# Patient Record
Sex: Male | Born: 1945 | Race: White | Hispanic: No | Marital: Married | Smoking: Former smoker
Health system: Southern US, Community
[De-identification: ages and names within clinical notes are randomized; demographics above are authoritative.]

## PROBLEM LIST (undated history)

## (undated) DIAGNOSIS — I10 Essential (primary) hypertension: Secondary | ICD-10-CM

## (undated) DIAGNOSIS — I471 Supraventricular tachycardia, unspecified: Secondary | ICD-10-CM

## (undated) DIAGNOSIS — K219 Gastro-esophageal reflux disease without esophagitis: Secondary | ICD-10-CM

## (undated) DIAGNOSIS — F419 Anxiety disorder, unspecified: Secondary | ICD-10-CM

## (undated) DIAGNOSIS — M1990 Unspecified osteoarthritis, unspecified site: Secondary | ICD-10-CM

## (undated) HISTORY — PX: ABDOMINAL HERNIA REPAIR: SHX539

## (undated) HISTORY — PX: PILONIDAL CYST EXCISION: SHX744

## (undated) HISTORY — PX: CHOLECYSTECTOMY: SHX55

## (undated) HISTORY — PX: UPPER GI ENDOSCOPY: SHX6162

## (undated) HISTORY — PX: APPENDECTOMY: SHX54

## (undated) HISTORY — PX: NISSEN FUNDOPLICATION: SHX2091

## (undated) HISTORY — PX: ABDOMINAL EXPLORATION SURGERY: SHX538

## (undated) HISTORY — PX: ROTATOR CUFF REPAIR: SHX139

## (undated) HISTORY — PX: COLONOSCOPY: SHX174

---

## 2018-07-25 ENCOUNTER — Other Ambulatory Visit: Payer: Self-pay | Admitting: Gastroenterology

## 2018-07-25 DIAGNOSIS — R1012 Left upper quadrant pain: Secondary | ICD-10-CM

## 2018-07-28 ENCOUNTER — Ambulatory Visit
Admission: RE | Admit: 2018-07-28 | Discharge: 2018-07-28 | Disposition: A | Discharge status: Home / Self Care | Payer: Medicare Other | Source: Ambulatory Visit | Attending: Gastroenterology | Admitting: Gastroenterology

## 2018-07-28 DIAGNOSIS — R1012 Left upper quadrant pain: Secondary | ICD-10-CM

## 2018-07-28 MED ORDER — IOPAMIDOL (ISOVUE-300) INJECTION 61%
100.0000 mL | Freq: Once | INTRAVENOUS | Status: AC | PRN
  Administered 2018-07-28: 100 mL via INTRAVENOUS

## 2019-07-25 ENCOUNTER — Other Ambulatory Visit: Payer: Self-pay

## 2019-07-25 ENCOUNTER — Ambulatory Visit (INDEPENDENT_AMBULATORY_CARE_PROVIDER_SITE_OTHER): Payer: Medicare Other | Admitting: Orthopaedic Surgery

## 2019-07-25 ENCOUNTER — Ambulatory Visit (INDEPENDENT_AMBULATORY_CARE_PROVIDER_SITE_OTHER): Payer: Medicare Other

## 2019-07-25 DIAGNOSIS — M25551 Pain in right hip: Secondary | ICD-10-CM | POA: Diagnosis not present

## 2019-07-25 DIAGNOSIS — M1611 Unilateral primary osteoarthritis, right hip: Secondary | ICD-10-CM | POA: Diagnosis not present

## 2019-07-25 DIAGNOSIS — M1612 Unilateral primary osteoarthritis, left hip: Secondary | ICD-10-CM

## 2019-07-25 NOTE — Progress Notes (Signed)
Office Visit Note   Patient: Jonathan Navarro           Date of Birth: Oct 04, 1945           MRN: [id number] Visit Date: 07/25/2019              Requested by: No referring provider defined for this encounter. PCP: Carman Ching, MD (Inactive)   Assessment & Plan: Visit Diagnoses:  1. Pain in right hip   2. Unilateral primary osteoarthritis, left hip   3. Unilateral primary osteoarthritis, right hip     Plan: The patient has severe end-stage arthritis of both his hips.  I went over his x-rays in detail.  We have recommended hip replacement surgery.  Since his right hip is more severely painful to him we will proceed with the right hip first.  I went over the risk and benefits of surgery in detail.  I went over his interoperative and postoperative course showing him a hip model explaining what the surgery involves.  Given the severity of his pain I do feel this is something we need to proceed with.  Seeing him ambulate he has such a Trendelenburg gait and is lurching with his gait but he is certainly a fall risk.  We will work on getting his surgery scheduled for this month.  This will be for his right hip first.  All question concerns were answered and addressed.  Follow-Up Instructions: Return for 2 weeks post-op.   Orders:  Orders Placed This Encounter  Procedures  . XR HIP UNILAT W OR W/O PELVIS 1V RIGHT   No orders of the defined types were placed in this encounter.     Procedures: No procedures performed   Clinical Data: No additional findings.   Subjective: Chief Complaint  Patient presents with  . Right Leg - Pain  . Left Knee - Pain  The patient is a very pleasant and active 74 year old gentleman sent from Dr. Marikay Alar of neurosurgery to evaluate and treat arthritis involving both the patient's hips.  His right hip hurts quite severely.  This is been getting worse for 3 years now.  He ambulates with a cane.  He has become a fall risk due to the severity of his  pain.  It hurts with rotating types of activities in the groin on both sides.  He ambulates with a cane.  He has tried and failed all forms conservative treatment.  This is gotten so worse over the last year that it is detrimentally affecting his mobility, his quality of life and his actives of daily living.  He is an avid golfer and cannot do that due to the severity of his pain.  He is on chronic hydrocodone.  He is not a diabetic.  He is not a smoker.  He does have a history of high blood pressure.  He lives in [place].  HPI  Review of Systems He currently denies any headache, chest pain, shortness of breath, fever, chills, nausea, vomiting  Objective: Vital Signs: There were no vitals taken for this visit.  Physical Exam He is alert and oriented x3 and in no acute distress Ortho Exam Examination of both hips shows severe pain with any attempts of internal or external rotation.  Both hips are severely stiff with limitations of internal and external rotation. Specialty Comments:  No specialty comments available.  Imaging: XR HIP UNILAT W OR W/O PELVIS 1V RIGHT  Result Date: 07/25/2019 A low AP pelvis and lateral  right hip shows severe end-stage arthritis of both hips.  There is complete loss of joint space.  There are large periarticular osteophytes.  There is flattening of both femoral heads and severe sclerotic changes.    PMFS History: Patient Active Problem List   Diagnosis Date Noted  . Unilateral primary osteoarthritis, left hip 07/25/2019  . Unilateral primary osteoarthritis, right hip 07/25/2019   No past medical history on file.  No family history on file.   Social History   Occupational History  . Not on file  Tobacco Use  . Smoking status: Not on file  Substance and Sexual Activity  . Alcohol use: Not on file  . Drug use: Not on file  . Sexual activity: Not on file

## 2019-08-03 ENCOUNTER — Other Ambulatory Visit: Payer: Self-pay

## 2019-08-06 ENCOUNTER — Other Ambulatory Visit: Payer: Self-pay | Admitting: Physician Assistant

## 2019-08-08 NOTE — Patient Instructions (Addendum)
DUE TO COVID-19 ONLY ONE VISITOR IS ALLOWED TO COME WITH YOU AND STAY IN THE WAITING ROOM ONLY DURING PRE OP AND PROCEDURE DAY OF SURGERY. THE 1 VISITOR MAY VISIT WITH YOU AFTER SURGERY IN YOUR PRIVATE ROOM DURING VISITING HOURS ONLY!  YOU NEED TO HAVE A COVID 19 TEST ON: 08/14/19 @ 2:00 pm , THIS TEST MUST BE DONE BEFORE SURGERY, COME  [address], [place] , [address].  [facility]) ONCE YOUR COVID TEST IS COMPLETED, PLEASE BEGIN THE QUARANTINE INSTRUCTIONS AS OUTLINED IN YOUR HANDOUT.                [name]    Your procedure is scheduled on: 08/17/19   Report to [facility] Main  Entrance   Report to admitting at: 8:30 AM     Call this number if you have problems the morning of surgery [phone]    Remember:    BRUSH YOUR TEETH MORNING OF SURGERY AND RINSE YOUR MOUTH OUT, NO CHEWING GUM CANDY OR MINTS.     Take these medicines the morning of surgery with A SIP OF WATER: DILTIAZEM,ESOMEPRAZOLE,METOPROLOL. XANAX AS NEEDED.                                 You may not have any metal on your body including hair pins and              piercings  Do not wear jewelry,lotions, powders or perfumes, deodorant             Men may shave face and neck.   Do not bring valuables to the hospital. [facility] IS NOT             RESPONSIBLE   FOR VALUABLES.  Contacts, dentures or bridgework may not be worn into surgery.  Leave suitcase in the car. After surgery it may be brought to your room.     Patients discharged the day of surgery will not be allowed to drive home. IF YOU ARE HAVING SURGERY AND GOING HOME THE SAME DAY, YOU MUST HAVE AN ADULT TO DRIVE YOU HOME AND BE WITH YOU FOR 24 HOURS. YOU MAY GO HOME BY TAXI OR UBER OR ORTHERWISE, BUT AN ADULT MUST ACCOMPANY YOU HOME AND STAY WITH YOU FOR 24 HOURS.  Name and phone number of your driver:  Special Instructions: N/A              Please read over the following fact sheets you were  given: _____________________________________________________________________             NO SOLID FOOD AFTER MIDNIGHT THE NIGHT PRIOR TO SURGERY. NOTHING BY MOUTH EXCEPT CLEAR LIQUIDS UNTIL: 8:00 AM . PLEASE FINISH ENSURE DRINK PER SURGEON ORDER  WHICH NEEDS TO BE COMPLETED AT : 8:00 AM.   CLEAR LIQUID DIET   Foods Allowed                                                                     Foods Excluded  Coffee and tea, regular and decaf  liquids that you cannot  Plain Jell-O any favor except red or purple                                           see through such as: Fruit ices (not with fruit pulp)                                     milk, soups, orange juice  Iced Popsicles                                    All solid food Carbonated beverages, regular and diet                                    Cranberry, grape and apple juices Sports drinks like Gatorade Lightly seasoned clear broth or consume(fat free) Sugar, honey syrup  Sample Menu Breakfast                                Lunch                                     Supper Cranberry juice                    Beef broth                            Chicken broth Jell-O                                     Grape juice                           Apple juice Coffee or tea                        Jell-O                                      Popsicle                                                Coffee or tea                        Coffee or tea  _____________________________________________________________________  [facility] - Preparing for Surgery Before surgery, you can play an important role.  Because skin is not sterile, your skin needs to be as free of germs as possible.  You can reduce the number of germs on your skin by washing with CHG (chlorahexidine gluconate) soap before surgery.  CHG is an antiseptic cleaner which kills  germs and bonds with the skin to continue killing germs even after  washing. Please DO NOT use if you have an allergy to CHG or antibacterial soaps.  If your skin becomes reddened/irritated stop using the CHG and inform your nurse when you arrive at Short Stay. Do not shave (including legs and underarms) for at least 48 hours prior to the first CHG shower.  You may shave your face/neck. Please follow these instructions carefully:  1.  Shower with CHG Soap the night before surgery and the  morning of Surgery.  2.  If you choose to wash your hair, wash your hair first as usual with your  normal  shampoo.  3.  After you shampoo, rinse your hair and body thoroughly to remove the  shampoo.                           4.  Use CHG as you would any other liquid soap.  You can apply chg directly  to the skin and wash                       Gently with a scrungie or clean washcloth.  5.  Apply the CHG Soap to your body ONLY FROM THE NECK DOWN.   Do not use on face/ open                           Wound or open sores. Avoid contact with eyes, ears mouth and genitals (private parts).                       Wash face,  Genitals (private parts) with your normal soap.             6.  Wash thoroughly, paying special attention to the area where your surgery  will be performed.  7.  Thoroughly rinse your body with warm water from the neck down.  8.  DO NOT shower/wash with your normal soap after using and rinsing off  the CHG Soap.                9.  Pat yourself dry with a clean towel.            10.  Wear clean pajamas.            11.  Place clean sheets on your bed the night of your first shower and do not  sleep with pets. Day of Surgery : Do not apply any lotions/deodorants the morning of surgery.  Please wear clean clothes to the hospital/surgery center.  FAILURE TO FOLLOW THESE INSTRUCTIONS MAY RESULT IN THE CANCELLATION OF YOUR SURGERY PATIENT SIGNATURE_________________________________  NURSE  SIGNATURE__________________________________  ________________________________________________________________________   Jonathan Navarro  An incentive spirometer is a tool that can help keep your lungs clear and active. This tool measures how well you are filling your lungs with each breath. Taking long deep breaths may help reverse or decrease the chance of developing breathing (pulmonary) problems (especially infection) following:  A long period of time when you are unable to move or be active. BEFORE THE PROCEDURE   If the spirometer includes an indicator to show your best effort, your nurse or respiratory therapist will set it to a desired goal.  If possible, sit up straight or lean slightly forward. Try not to slouch.  Hold the incentive spirometer in an  upright position. INSTRUCTIONS FOR USE  1. Sit on the edge of your bed if possible, or sit up as far as you can in bed or on a chair. 2. Hold the incentive spirometer in an upright position. 3. Breathe out normally. 4. Place the mouthpiece in your mouth and seal your lips tightly around it. 5. Breathe in slowly and as deeply as possible, raising the piston or the ball toward the top of the column. 6. Hold your breath for 3-5 seconds or for as long as possible. Allow the piston or ball to fall to the bottom of the column. 7. Remove the mouthpiece from your mouth and breathe out normally. 8. Rest for a few seconds and repeat Steps 1 through 7 at least 10 times every 1-2 hours when you are awake. Take your time and take a few normal breaths between deep breaths. 9. The spirometer may include an indicator to show your best effort. Use the indicator as a goal to work toward during each repetition. 10. After each set of 10 deep breaths, practice coughing to be sure your lungs are clear. If you have an incision (the cut made at the time of surgery), support your incision when coughing by placing a pillow or rolled up towels firmly  against it. Once you are able to get out of bed, walk around indoors and cough well. You may stop using the incentive spirometer when instructed by your caregiver.  RISKS AND COMPLICATIONS  Take your time so you do not get dizzy or light-headed.  If you are in pain, you may need to take or ask for pain medication before doing incentive spirometry. It is harder to take a deep breath if you are having pain. AFTER USE  Rest and breathe slowly and easily.  It can be helpful to keep track of a log of your progress. Your caregiver can provide you with a simple table to help with this. If you are using the spirometer at home, follow these instructions: [place] IF:   You are having difficultly using the spirometer.  You have trouble using the spirometer as often as instructed.  Your pain medication is not giving enough relief while using the spirometer.  You develop fever of 100.5 F (38.1 C) or higher. SEEK IMMEDIATE MEDICAL CARE IF:   You cough up bloody sputum that had not been present before.  You develop fever of 102 F (38.9 C) or greater.  You develop worsening pain at or near the incision site. MAKE SURE YOU:   Understand these instructions.  Will watch your condition.  Will get help right away if you are not doing well or get worse. Document Released: 09/20/2006 Document Revised: 08/02/2011 Document Reviewed: 11/21/2006 [facility] Patient Information 2014 [place], [place].   ________________________________________________________________________

## 2019-08-09 ENCOUNTER — Other Ambulatory Visit: Payer: Self-pay

## 2019-08-09 ENCOUNTER — Encounter (HOSPITAL_COMMUNITY)
Admission: RE | Admit: 2019-08-09 | Discharge: 2019-08-09 | Disposition: A | Discharge status: Home / Self Care | Payer: Medicare Other | Source: Ambulatory Visit | Attending: Orthopaedic Surgery | Admitting: Orthopaedic Surgery

## 2019-08-09 ENCOUNTER — Encounter (HOSPITAL_COMMUNITY): Payer: Self-pay

## 2019-08-09 ENCOUNTER — Other Ambulatory Visit (HOSPITAL_COMMUNITY): Payer: Medicare Other

## 2019-08-09 DIAGNOSIS — I493 Ventricular premature depolarization: Secondary | ICD-10-CM | POA: Insufficient documentation

## 2019-08-09 DIAGNOSIS — I440 Atrioventricular block, first degree: Secondary | ICD-10-CM | POA: Diagnosis not present

## 2019-08-09 DIAGNOSIS — Z01818 Encounter for other preprocedural examination: Secondary | ICD-10-CM | POA: Insufficient documentation

## 2019-08-09 HISTORY — DX: Essential (primary) hypertension: I10

## 2019-08-09 HISTORY — DX: Unspecified osteoarthritis, unspecified site: M19.90

## 2019-08-09 LAB — SURGICAL PCR SCREEN
MRSA, PCR: NEGATIVE
Staphylococcus aureus: NEGATIVE

## 2019-08-09 LAB — CBC
HCT: 48 % (ref 39.0–52.0)
Hemoglobin: 16.2 g/dL (ref 13.0–17.0)
MCH: 30.3 pg (ref 26.0–34.0)
MCHC: 33.8 g/dL (ref 30.0–36.0)
MCV: 89.9 fL (ref 80.0–100.0)
Platelets: 192 10*3/uL (ref 150–400)
RBC: 5.34 MIL/uL (ref 4.22–5.81)
RDW: 11.9 % (ref 11.5–15.5)
WBC: 8.1 10*3/uL (ref 4.0–10.5)
nRBC: 0 % (ref 0.0–0.2)

## 2019-08-09 LAB — BASIC METABOLIC PANEL
Anion gap: 10 (ref 5–15)
BUN: 19 mg/dL (ref 8–23)
CO2: 26 mmol/L (ref 22–32)
Calcium: 9.2 mg/dL (ref 8.9–10.3)
Chloride: 105 mmol/L (ref 98–111)
Creatinine, Ser: 1.28 mg/dL — ABNORMAL HIGH (ref 0.61–1.24)
GFR calc Af Amer: >60 mL/min (ref >60)
GFR calc non Af Amer: 56 mL/min — ABNORMAL LOW (ref >60)
Glucose, Bld: 89 mg/dL (ref 70–99)
Potassium: 4 mmol/L (ref 3.5–5.1)
Sodium: 141 mmol/L (ref 135–145)

## 2019-08-09 NOTE — Progress Notes (Signed)
PCP - Loa Socks. LOV: 06/1999 Cardiologist -   Chest x-ray -  EKG -  Stress Test -  ECHO -  Cardiac Cath -   Sleep Study -  CPAP -   Fasting Blood Sugar -  Checks Blood Sugar _____ times a day  Blood Thinner Instructions: Aspirin Instructions:RN advise pt. To call MD. For instructions. Last Dose:  Anesthesia review:   Patient denies shortness of breath, fever, cough and chest pain at PAT appointment   Patient verbalized understanding of instructions that were given to them at the PAT appointment. Patient was also instructed that they will need to review over the PAT instructions again at home before surgery.

## 2019-08-14 ENCOUNTER — Other Ambulatory Visit (HOSPITAL_COMMUNITY)
Admission: RE | Admit: 2019-08-14 | Discharge: 2019-08-14 | Disposition: A | Discharge status: Home / Self Care | Payer: Medicare Other | Source: Ambulatory Visit | Attending: Orthopaedic Surgery | Admitting: Orthopaedic Surgery

## 2019-08-14 DIAGNOSIS — Z01812 Encounter for preprocedural laboratory examination: Secondary | ICD-10-CM | POA: Insufficient documentation

## 2019-08-14 DIAGNOSIS — Z96641 Presence of right artificial hip joint: Secondary | ICD-10-CM | POA: Diagnosis present

## 2019-08-14 DIAGNOSIS — Z7982 Long term (current) use of aspirin: Secondary | ICD-10-CM | POA: Diagnosis not present

## 2019-08-14 DIAGNOSIS — Z87891 Personal history of nicotine dependence: Secondary | ICD-10-CM | POA: Diagnosis not present

## 2019-08-14 DIAGNOSIS — Z20822 Contact with and (suspected) exposure to covid-19: Secondary | ICD-10-CM | POA: Insufficient documentation

## 2019-08-14 DIAGNOSIS — I10 Essential (primary) hypertension: Secondary | ICD-10-CM | POA: Diagnosis not present

## 2019-08-14 DIAGNOSIS — M160 Bilateral primary osteoarthritis of hip: Secondary | ICD-10-CM | POA: Diagnosis not present

## 2019-08-14 DIAGNOSIS — Z886 Allergy status to analgesic agent status: Secondary | ICD-10-CM | POA: Diagnosis not present

## 2019-08-14 DIAGNOSIS — Z885 Allergy status to narcotic agent status: Secondary | ICD-10-CM | POA: Diagnosis not present

## 2019-08-14 DIAGNOSIS — Z79899 Other long term (current) drug therapy: Secondary | ICD-10-CM | POA: Diagnosis not present

## 2019-08-14 LAB — SARS CORONAVIRUS 2 (TAT 6-24 HRS): SARS Coronavirus 2: NEGATIVE

## 2019-08-16 NOTE — H&P (Signed)
TOTAL HIP ADMISSION H&P  Patient is admitted for right total hip arthroplasty.  Subjective:  Chief Complaint: right hip pain  HPI: Jonathan Navarro, 74 y.o. male, has a history of pain and functional disability in the right hip(s) due to arthritis and patient has failed non-surgical conservative treatments for greater than 12 weeks to include NSAID's and/or analgesics, corticosteriod injections, flexibility and strengthening excercises, use of assistive devices, weight reduction as appropriate and activity modification.  Onset of symptoms was gradual starting 3 years ago with gradually worsening course since that time.The patient noted no past surgery on the right hip(s).  Patient currently rates pain in the right hip at 10 out of 10 with activity. Patient has night pain, worsening of pain with activity and weight bearing, trendelenberg gait, pain that interfers with activities of daily living and pain with passive range of motion. Patient has evidence of subchondral cysts, subchondral sclerosis, periarticular osteophytes and joint space narrowing by imaging studies. This condition presents safety issues increasing the risk of falls.  There is no current active infection.  Patient Active Problem List   Diagnosis Date Noted  . Unilateral primary osteoarthritis, left hip 07/25/2019  . Unilateral primary osteoarthritis, right hip 07/25/2019   Past Medical History:  Diagnosis Date  . Arthritis   . Hypertension     Past Surgical History:  Procedure Laterality Date  . ABDOMINAL EXPLORATION SURGERY    . CHOLECYSTECTOMY    . ROTATOR CUFF REPAIR Right   . ROTATOR CUFF REPAIR Left     No current facility-administered medications for this encounter.   Current Outpatient Medications  Medication Sig Dispense Refill Last Dose  . ALPRAZolam (XANAX) 1 MG tablet Take 1 mg by mouth 2 (two) times daily as needed for anxiety.     Marland Kitchen aspirin 81 MG EC tablet Take 81 mg by mouth daily.     . Cholecalciferol 50  MCG (2000 UT) CAPS Take 2,000 Units by mouth daily.     . cholestyramine (QUESTRAN) 4 GM/DOSE powder Take 4 g by mouth daily as needed.     . diltiazem (TIAZAC) 360 MG 24 hr capsule Take 360 mg by mouth daily.     Marland Kitchen esomeprazole (NEXIUM) 40 MG capsule Take 40 mg by mouth daily before breakfast.     . folic acid (FOLVITE) 967 MCG tablet Take 400 mcg by mouth daily.     Marland Kitchen HYDROcodone-acetaminophen (NORCO) 7.5-325 MG tablet Take 1 tablet by mouth 4 (four) times daily as needed for pain.     . metoprolol tartrate (LOPRESSOR) 50 MG tablet Take 50 mg by mouth 2 (two) times daily.     . Multiple Vitamin (MULTIVITAMIN ADULT PO) Take 1 tablet by mouth daily.     Marland Kitchen OVER THE COUNTER MEDICATION Take 1 capsule by mouth daily. pyrroloquinoline quinone (PQQ)     . sucralfate (CARAFATE) 1 g tablet Take 1 g by mouth 3 (three) times daily as needed (stomach acid).       Allergies  Allergen Reactions  . Codeine Nausea Only  . Nsaids     Severe irregular heartbeat    Social History   Tobacco Use  . Smoking status: Former Research scientist (life sciences)  . Smokeless tobacco: Never Used  Substance Use Topics  . Alcohol use: Yes    Comment: occ.    No family history on file.   Review of Systems  Musculoskeletal: Positive for joint swelling.  All other systems reviewed and are negative.   Objective:  Physical Exam  Constitutional: He is oriented to person, place, and time. He appears well-developed and well-nourished.  HENT:  Head: Normocephalic and atraumatic.  Eyes: Pupils are equal, round, and reactive to light. EOM are normal.  Cardiovascular: Normal rate.  Respiratory: Effort normal.  GI: Soft.  Musculoskeletal:     Cervical back: Normal range of motion and neck supple.     Right hip: Tenderness and bony tenderness present. Decreased range of motion. Decreased strength.  Neurological: He is alert and oriented to person, place, and time.  Skin: Skin is warm and dry.  Psychiatric: He has a normal mood and affect.     Vital signs in last 24 hours:    Labs:   Estimated body mass index is 31.23 kg/m as calculated from the following:   Height as of 08/09/19: 6' (1.829 m).   Weight as of 08/09/19: 104.4 kg.   Imaging Review Plain radiographs demonstrate severe degenerative joint disease of the right hip(s). The bone quality appears to be good for age and reported activity level.      Assessment/Plan:  End stage arthritis, right hip(s)  The patient history, physical examination, clinical judgement of the provider and imaging studies are consistent with end stage degenerative joint disease of the right hip(s) and total hip arthroplasty is deemed medically necessary. The treatment options including medical management, injection therapy, arthroscopy and arthroplasty were discussed at length. The risks and benefits of total hip arthroplasty were presented and reviewed. The risks due to aseptic loosening, infection, stiffness, dislocation/subluxation,  thromboembolic complications and other imponderables were discussed.  The patient acknowledged the explanation, agreed to proceed with the plan and consent was signed. Patient is being admitted for inpatient treatment for surgery, pain control, PT, OT, prophylactic antibiotics, VTE prophylaxis, progressive ambulation and ADL's and discharge planning.The patient is planning to be discharged home with home health services

## 2019-08-17 ENCOUNTER — Other Ambulatory Visit: Payer: Self-pay

## 2019-08-17 ENCOUNTER — Ambulatory Visit (HOSPITAL_COMMUNITY): Payer: Medicare Other | Admitting: Physician Assistant

## 2019-08-17 ENCOUNTER — Ambulatory Visit (HOSPITAL_COMMUNITY): Payer: Medicare Other

## 2019-08-17 ENCOUNTER — Encounter (HOSPITAL_COMMUNITY)
Admission: RE | Disposition: A | Discharge status: Home / Self Care | Payer: Self-pay | Source: Home / Self Care | Attending: Orthopaedic Surgery

## 2019-08-17 ENCOUNTER — Encounter (HOSPITAL_COMMUNITY): Payer: Self-pay | Admitting: Orthopaedic Surgery

## 2019-08-17 ENCOUNTER — Ambulatory Visit (HOSPITAL_COMMUNITY): Payer: Medicare Other | Admitting: Certified Registered Nurse Anesthetist

## 2019-08-17 ENCOUNTER — Observation Stay (HOSPITAL_COMMUNITY): Payer: Medicare Other

## 2019-08-17 ENCOUNTER — Observation Stay (HOSPITAL_COMMUNITY)
Admission: RE | Admit: 2019-08-17 | Discharge: 2019-08-18 | Disposition: A | Discharge status: Home / Self Care | Payer: Medicare Other | Attending: Orthopaedic Surgery | Admitting: Orthopaedic Surgery

## 2019-08-17 DIAGNOSIS — Z885 Allergy status to narcotic agent status: Secondary | ICD-10-CM | POA: Insufficient documentation

## 2019-08-17 DIAGNOSIS — M1611 Unilateral primary osteoarthritis, right hip: Secondary | ICD-10-CM | POA: Diagnosis not present

## 2019-08-17 DIAGNOSIS — Z87891 Personal history of nicotine dependence: Secondary | ICD-10-CM | POA: Insufficient documentation

## 2019-08-17 DIAGNOSIS — Z79899 Other long term (current) drug therapy: Secondary | ICD-10-CM | POA: Diagnosis not present

## 2019-08-17 DIAGNOSIS — Z419 Encounter for procedure for purposes other than remedying health state, unspecified: Secondary | ICD-10-CM

## 2019-08-17 DIAGNOSIS — M160 Bilateral primary osteoarthritis of hip: Principal | ICD-10-CM | POA: Insufficient documentation

## 2019-08-17 DIAGNOSIS — Z20822 Contact with and (suspected) exposure to covid-19: Secondary | ICD-10-CM | POA: Diagnosis not present

## 2019-08-17 DIAGNOSIS — Z96641 Presence of right artificial hip joint: Secondary | ICD-10-CM

## 2019-08-17 DIAGNOSIS — Z886 Allergy status to analgesic agent status: Secondary | ICD-10-CM | POA: Insufficient documentation

## 2019-08-17 DIAGNOSIS — Z7982 Long term (current) use of aspirin: Secondary | ICD-10-CM | POA: Insufficient documentation

## 2019-08-17 DIAGNOSIS — I10 Essential (primary) hypertension: Secondary | ICD-10-CM | POA: Diagnosis not present

## 2019-08-17 HISTORY — PX: TOTAL HIP ARTHROPLASTY: SHX124

## 2019-08-17 SURGERY — ARTHROPLASTY, HIP, TOTAL, ANTERIOR APPROACH
Anesthesia: Spinal | Site: Hip | Laterality: Right

## 2019-08-17 MED ORDER — DEXAMETHASONE SODIUM PHOSPHATE 10 MG/ML IJ SOLN
INTRAMUSCULAR | Status: DC | PRN
  Administered 2019-08-17: 10 mg via INTRAVENOUS

## 2019-08-17 MED ORDER — PANTOPRAZOLE SODIUM 40 MG PO TBEC
40.0000 mg | DELAYED_RELEASE_TABLET | Freq: Every day | ORAL | Status: DC
  Administered 2019-08-18: 11:00:00 40 mg via ORAL
  Filled 2019-08-17: qty 1

## 2019-08-17 MED ORDER — PROPOFOL 10 MG/ML IV BOLUS
INTRAVENOUS | Status: DC | PRN
  Administered 2019-08-17: 20 mg via INTRAVENOUS

## 2019-08-17 MED ORDER — LACTATED RINGERS IV SOLN: INTRAVENOUS | Status: DC

## 2019-08-17 MED ORDER — ONDANSETRON HCL 4 MG PO TABS: 4.0000 mg | ORAL_TABLET | Freq: Four times a day (QID) | ORAL | Status: DC | PRN

## 2019-08-17 MED ORDER — ALUM & MAG HYDROXIDE-SIMETH 200-200-20 MG/5ML PO SUSP: 30.0000 mL | ORAL | Status: DC | PRN

## 2019-08-17 MED ORDER — CHLORHEXIDINE GLUCONATE 4 % EX LIQD: 60.0000 mL | Freq: Once | CUTANEOUS | Status: DC

## 2019-08-17 MED ORDER — CEFAZOLIN SODIUM-DEXTROSE 1-4 GM/50ML-% IV SOLN
1.0000 g | Freq: Four times a day (QID) | INTRAVENOUS | Status: AC
  Administered 2019-08-17 – 2019-08-18 (×2): 1 g via INTRAVENOUS
  Filled 2019-08-17 (×2): qty 50

## 2019-08-17 MED ORDER — FENTANYL CITRATE (PF) 100 MCG/2ML IJ SOLN
INTRAMUSCULAR | Status: AC
  Filled 2019-08-17: qty 2

## 2019-08-17 MED ORDER — METHOCARBAMOL 500 MG IVPB - SIMPLE MED
INTRAVENOUS | Status: AC
  Filled 2019-08-17: qty 50

## 2019-08-17 MED ORDER — RIVAROXABAN 10 MG PO TABS
10.0000 mg | ORAL_TABLET | Freq: Every day | ORAL | Status: DC
  Administered 2019-08-18: 08:00:00 10 mg via ORAL
  Filled 2019-08-17: qty 1

## 2019-08-17 MED ORDER — PHENOL 1.4 % MT LIQD: 1.0000 | OROMUCOSAL | Status: DC | PRN

## 2019-08-17 MED ORDER — METHOCARBAMOL 500 MG IVPB - SIMPLE MED
500.0000 mg | Freq: Four times a day (QID) | INTRAVENOUS | Status: DC | PRN
  Filled 2019-08-17: qty 50

## 2019-08-17 MED ORDER — ONDANSETRON HCL 4 MG/2ML IJ SOLN
INTRAMUSCULAR | Status: AC
  Filled 2019-08-17: qty 2

## 2019-08-17 MED ORDER — ONDANSETRON HCL 4 MG/2ML IJ SOLN: 4.0000 mg | Freq: Four times a day (QID) | INTRAMUSCULAR | Status: DC | PRN

## 2019-08-17 MED ORDER — SODIUM CHLORIDE 0.9 % IR SOLN
Status: DC | PRN
  Administered 2019-08-17: 1000 mL

## 2019-08-17 MED ORDER — VITAMIN D 25 MCG (1000 UNIT) PO TABS
2000.0000 [IU] | ORAL_TABLET | Freq: Every day | ORAL | Status: DC
  Administered 2019-08-18: 2000 [IU] via ORAL
  Filled 2019-08-17: qty 2

## 2019-08-17 MED ORDER — HYDROMORPHONE HCL 1 MG/ML IJ SOLN
INTRAMUSCULAR | Status: AC
  Filled 2019-08-17: qty 1

## 2019-08-17 MED ORDER — CEFAZOLIN SODIUM-DEXTROSE 2-4 GM/100ML-% IV SOLN
2.0000 g | INTRAVENOUS | Status: AC
  Administered 2019-08-17: 2 g via INTRAVENOUS
  Filled 2019-08-17: qty 100

## 2019-08-17 MED ORDER — STERILE WATER FOR IRRIGATION IR SOLN
Status: DC | PRN
  Administered 2019-08-17 (×2): 1000 mL

## 2019-08-17 MED ORDER — MIDAZOLAM HCL 2 MG/2ML IJ SOLN
INTRAMUSCULAR | Status: AC
  Filled 2019-08-17: qty 2

## 2019-08-17 MED ORDER — SUCRALFATE 1 G PO TABS: 1.0000 g | ORAL_TABLET | Freq: Three times a day (TID) | ORAL | Status: DC | PRN

## 2019-08-17 MED ORDER — GABAPENTIN 100 MG PO CAPS
100.0000 mg | ORAL_CAPSULE | Freq: Three times a day (TID) | ORAL | Status: DC
  Administered 2019-08-17 – 2019-08-18 (×2): 100 mg via ORAL
  Filled 2019-08-17 (×2): qty 1

## 2019-08-17 MED ORDER — ACETAMINOPHEN 10 MG/ML IV SOLN
INTRAVENOUS | Status: AC
  Filled 2019-08-17: qty 100

## 2019-08-17 MED ORDER — OXYCODONE HCL 5 MG PO TABS
10.0000 mg | ORAL_TABLET | ORAL | Status: DC | PRN
  Administered 2019-08-17 – 2019-08-18 (×2): 10 mg via ORAL
  Administered 2019-08-18: 15 mg via ORAL
  Administered 2019-08-18: 10 mg via ORAL
  Filled 2019-08-17: qty 3
  Filled 2019-08-17 (×2): qty 2

## 2019-08-17 MED ORDER — SODIUM CHLORIDE 0.9 % IV SOLN: INTRAVENOUS | Status: DC

## 2019-08-17 MED ORDER — FOLIC ACID 1 MG PO TABS
500.0000 ug | ORAL_TABLET | Freq: Every day | ORAL | Status: DC
  Administered 2019-08-18: 0.5 mg via ORAL
  Filled 2019-08-17: qty 1

## 2019-08-17 MED ORDER — PROMETHAZINE HCL 25 MG/ML IJ SOLN: 6.2500 mg | INTRAMUSCULAR | Status: DC | PRN

## 2019-08-17 MED ORDER — HYDROMORPHONE HCL 1 MG/ML IJ SOLN
0.2500 mg | INTRAMUSCULAR | Status: DC | PRN
  Administered 2019-08-17 (×4): 0.5 mg via INTRAVENOUS

## 2019-08-17 MED ORDER — DIPHENHYDRAMINE HCL 12.5 MG/5ML PO ELIX: 12.5000 mg | ORAL_SOLUTION | ORAL | Status: DC | PRN

## 2019-08-17 MED ORDER — MIDAZOLAM HCL 5 MG/5ML IJ SOLN
INTRAMUSCULAR | Status: DC | PRN
  Administered 2019-08-17 (×2): 1 mg via INTRAVENOUS

## 2019-08-17 MED ORDER — MEPERIDINE HCL 50 MG/ML IJ SOLN: 6.2500 mg | INTRAMUSCULAR | Status: DC | PRN

## 2019-08-17 MED ORDER — DOCUSATE SODIUM 100 MG PO CAPS
100.0000 mg | ORAL_CAPSULE | Freq: Two times a day (BID) | ORAL | Status: DC
  Administered 2019-08-17 – 2019-08-18 (×2): 100 mg via ORAL
  Filled 2019-08-17 (×2): qty 1

## 2019-08-17 MED ORDER — ONDANSETRON HCL 4 MG/2ML IJ SOLN
INTRAMUSCULAR | Status: DC | PRN
  Administered 2019-08-17: 4 mg via INTRAVENOUS

## 2019-08-17 MED ORDER — HYDROMORPHONE HCL 1 MG/ML IJ SOLN
0.5000 mg | INTRAMUSCULAR | Status: DC | PRN
  Administered 2019-08-17 – 2019-08-18 (×3): 1 mg via INTRAVENOUS
  Filled 2019-08-17 (×3): qty 1

## 2019-08-17 MED ORDER — ALPRAZOLAM 1 MG PO TABS: 1.0000 mg | ORAL_TABLET | Freq: Two times a day (BID) | ORAL | Status: DC | PRN

## 2019-08-17 MED ORDER — PROPOFOL 10 MG/ML IV BOLUS: INTRAVENOUS | Status: DC | PRN

## 2019-08-17 MED ORDER — ACETAMINOPHEN 325 MG PO TABS: 325.0000 mg | ORAL_TABLET | Freq: Four times a day (QID) | ORAL | Status: DC | PRN

## 2019-08-17 MED ORDER — METOCLOPRAMIDE HCL 5 MG/ML IJ SOLN: 5.0000 mg | Freq: Three times a day (TID) | INTRAMUSCULAR | Status: DC | PRN

## 2019-08-17 MED ORDER — POLYETHYLENE GLYCOL 3350 17 G PO PACK: 17.0000 g | PACK | Freq: Every day | ORAL | Status: DC | PRN

## 2019-08-17 MED ORDER — METHOCARBAMOL 500 MG PO TABS
500.0000 mg | ORAL_TABLET | Freq: Four times a day (QID) | ORAL | Status: DC | PRN
  Administered 2019-08-17 – 2019-08-18 (×3): 500 mg via ORAL
  Filled 2019-08-17 (×2): qty 1

## 2019-08-17 MED ORDER — OXYCODONE HCL 5 MG PO TABS
5.0000 mg | ORAL_TABLET | ORAL | Status: DC | PRN
  Administered 2019-08-17: 5 mg via ORAL
  Filled 2019-08-17 (×2): qty 2

## 2019-08-17 MED ORDER — FENTANYL CITRATE (PF) 100 MCG/2ML IJ SOLN
INTRAMUSCULAR | Status: DC | PRN
  Administered 2019-08-17: 25 ug via INTRAVENOUS

## 2019-08-17 MED ORDER — METOCLOPRAMIDE HCL 5 MG PO TABS: 5.0000 mg | ORAL_TABLET | Freq: Three times a day (TID) | ORAL | Status: DC | PRN

## 2019-08-17 MED ORDER — PROPOFOL 1000 MG/100ML IV EMUL
INTRAVENOUS | Status: AC
  Filled 2019-08-17: qty 100

## 2019-08-17 MED ORDER — POVIDONE-IODINE 10 % EX SWAB
2.0000 "application " | Freq: Once | CUTANEOUS | Status: AC
  Administered 2019-08-17: 2 via TOPICAL

## 2019-08-17 MED ORDER — TRANEXAMIC ACID-NACL 1000-0.7 MG/100ML-% IV SOLN
1000.0000 mg | INTRAVENOUS | Status: AC
  Administered 2019-08-17: 1000 mg via INTRAVENOUS
  Filled 2019-08-17: qty 100

## 2019-08-17 MED ORDER — PROPOFOL 500 MG/50ML IV EMUL
INTRAVENOUS | Status: DC | PRN
  Administered 2019-08-17: 50 ug/kg/min via INTRAVENOUS
  Administered 2019-08-17: 25 ug/kg/min via INTRAVENOUS

## 2019-08-17 MED ORDER — 0.9 % SODIUM CHLORIDE (POUR BTL) OPTIME
TOPICAL | Status: DC | PRN
  Administered 2019-08-17: 1000 mL

## 2019-08-17 MED ORDER — ACETAMINOPHEN 10 MG/ML IV SOLN
1000.0000 mg | Freq: Once | INTRAVENOUS | Status: DC | PRN
  Administered 2019-08-17: 1000 mg via INTRAVENOUS

## 2019-08-17 MED ORDER — DEXAMETHASONE SODIUM PHOSPHATE 10 MG/ML IJ SOLN
INTRAMUSCULAR | Status: AC
  Filled 2019-08-17: qty 1

## 2019-08-17 MED ORDER — DILTIAZEM HCL ER BEADS 240 MG PO CP24
360.0000 mg | ORAL_CAPSULE | Freq: Every day | ORAL | Status: DC
  Filled 2019-08-17: qty 1

## 2019-08-17 MED ORDER — MENTHOL 3 MG MT LOZG: 1.0000 | LOZENGE | OROMUCOSAL | Status: DC | PRN

## 2019-08-17 MED ORDER — METOPROLOL TARTRATE 50 MG PO TABS
50.0000 mg | ORAL_TABLET | Freq: Two times a day (BID) | ORAL | Status: DC
  Administered 2019-08-17 – 2019-08-18 (×2): 50 mg via ORAL
  Filled 2019-08-17 (×2): qty 1

## 2019-08-17 MED ORDER — BUPIVACAINE IN DEXTROSE 0.75-8.25 % IT SOLN
INTRATHECAL | Status: DC | PRN
  Administered 2019-08-17: 1.4 mL via INTRATHECAL

## 2019-08-17 SURGICAL SUPPLY — 42 items
BAG ZIPLOCK 12X15 (MISCELLANEOUS) IMPLANT
BENZOIN TINCTURE PRP APPL 2/3 (GAUZE/BANDAGES/DRESSINGS) IMPLANT
BLADE SAW SGTL 18X1.27X75 (BLADE) ×2 IMPLANT
BLADE SAW SGTL 18X1.27X75MM (BLADE) ×1
CLOSURE WOUND 1/2 X4 (GAUZE/BANDAGES/DRESSINGS)
COVER PERINEAL POST (MISCELLANEOUS) ×3 IMPLANT
COVER SURGICAL LIGHT HANDLE (MISCELLANEOUS) ×3 IMPLANT
COVER WAND RF STERILE (DRAPES) ×3 IMPLANT
CUP ACET PNNCL SECTR W/GRIP 56 (Hips) IMPLANT
DRAPE STERI IOBAN 125X83 (DRAPES) ×3 IMPLANT
DRAPE U-SHAPE 47X51 STRL (DRAPES) ×6 IMPLANT
DRSG AQUACEL AG ADV 3.5X10 (GAUZE/BANDAGES/DRESSINGS) ×3 IMPLANT
DURAPREP 26ML APPLICATOR (WOUND CARE) ×3 IMPLANT
ELECT REM PT RETURN 15FT ADLT (MISCELLANEOUS) ×3 IMPLANT
GAUZE XEROFORM 1X8 LF (GAUZE/BANDAGES/DRESSINGS) ×3 IMPLANT
GLOVE BIO SURGEON STRL SZ7.5 (GLOVE) ×3 IMPLANT
GLOVE BIOGEL PI IND STRL 8 (GLOVE) ×2 IMPLANT
GLOVE BIOGEL PI INDICATOR 8 (GLOVE) ×4
GLOVE ECLIPSE 8.0 STRL XLNG CF (GLOVE) ×3 IMPLANT
GOWN STRL REUS W/TWL XL LVL3 (GOWN DISPOSABLE) ×6 IMPLANT
HANDPIECE INTERPULSE COAX TIP (DISPOSABLE) ×2
HEAD M SROM 36MM PLUS 1.5 (Hips) IMPLANT
HOLDER FOLEY CATH W/STRAP (MISCELLANEOUS) ×3 IMPLANT
KIT TURNOVER KIT A (KITS) IMPLANT
PACK ANTERIOR HIP CUSTOM (KITS) ×3 IMPLANT
PENCIL SMOKE EVACUATOR (MISCELLANEOUS) IMPLANT
PINN SECTOR W/GRIP ACE CUP 56 (Hips) ×3 IMPLANT
PINNACLE ALTRX PLUS 4 N 36X56 (Hips) ×2 IMPLANT
SET HNDPC FAN SPRY TIP SCT (DISPOSABLE) ×1 IMPLANT
SROM M HEAD 36MM PLUS 1.5 (Hips) ×3 IMPLANT
STAPLER VISISTAT 35W (STAPLE) IMPLANT
STEM CORAIL KA14 (Stem) ×2 IMPLANT
STRIP CLOSURE SKIN 1/2X4 (GAUZE/BANDAGES/DRESSINGS) IMPLANT
SUT ETHIBOND NAB CT1 #1 30IN (SUTURE) ×3 IMPLANT
SUT ETHILON 2 0 PS N (SUTURE) IMPLANT
SUT MNCRL AB 4-0 PS2 18 (SUTURE) IMPLANT
SUT VIC AB 0 CT1 36 (SUTURE) ×3 IMPLANT
SUT VIC AB 1 CT1 36 (SUTURE) ×3 IMPLANT
SUT VIC AB 2-0 CT1 27 (SUTURE) ×4
SUT VIC AB 2-0 CT1 TAPERPNT 27 (SUTURE) ×2 IMPLANT
TRAY FOLEY MTR SLVR 16FR STAT (SET/KITS/TRAYS/PACK) IMPLANT
YANKAUER SUCT BULB TIP 10FT TU (MISCELLANEOUS) ×3 IMPLANT

## 2019-08-17 NOTE — Anesthesia Procedure Notes (Signed)
Spinal  Patient location during procedure: OR Start time: 08/17/2019 11:43 AM End time: 08/17/2019 11:46 AM Staffing Performed: anesthesiologist  Anesthesiologist: Leilani Able, MD Preanesthetic Checklist Completed: patient identified, IV checked, site marked, risks and benefits discussed, surgical consent, monitors and equipment checked, pre-op evaluation and timeout performed Spinal Block Patient position: sitting Prep: DuraPrep and site prepped and draped Patient monitoring: continuous pulse ox and blood pressure Approach: midline Location: L3-4 Injection technique: single-shot Needle Needle type: Pencan  Needle gauge: 24 G Needle length: 10 cm Needle insertion depth: 7 cm Assessment Sensory level: T8

## 2019-08-17 NOTE — Brief Op Note (Signed)
08/17/2019  12:53 PM  PATIENT:  Jonathan Navarro  74 y.o. male  PRE-OPERATIVE DIAGNOSIS:  Osteoarthritis Right Hip  POST-OPERATIVE DIAGNOSIS:  Osteoarthritis Right Hip  PROCEDURE:  Procedure(s): RIGHT TOTAL HIP ARTHROPLASTY ANTERIOR APPROACH (Right)  SURGEON:  Surgeon(s) and Role:    Kathryne Hitch, MD - Primary  PHYSICIAN ASSISTANT: Rexene Edison, PA-C  ANESTHESIA:   spinal  EBL:  300 mL   COUNTS:  YES  DICTATION: .Other Dictation: Dictation Number [id number]  PLAN OF CARE: Admit for overnight observation  PATIENT DISPOSITION:  PACU - hemodynamically stable.   Delay start of Pharmacological VTE agent (>24hrs) due to surgical blood loss or risk of bleeding: no

## 2019-08-17 NOTE — Anesthesia Postprocedure Evaluation (Signed)
Anesthesia Post Note  Patient: Jonathan Navarro  Procedure(s) Performed: RIGHT TOTAL HIP ARTHROPLASTY ANTERIOR APPROACH (Right Hip)     Patient location during evaluation: PACU Anesthesia Type: Spinal Level of consciousness: awake and sedated Pain management: pain level controlled Vital Signs Assessment: post-procedure vital signs reviewed and stable Respiratory status: spontaneous breathing Cardiovascular status: stable Postop Assessment: no headache, no backache, spinal receding, patient able to bend at knees and no apparent nausea or vomiting Anesthetic complications: no    Last Vitals:  Vitals:   08/17/19 1430 08/17/19 1445  BP: 136/82 131/80  Pulse: 60 (!) 53  Resp: 11 (!) 8  Temp:    SpO2: 93% 95%    Last Pain:  Vitals:   08/17/19 1445  TempSrc:   PainSc: 4    Pain Goal: Patients Stated Pain Goal: 3 (08/17/19 5500)              Epidural/Spinal Function Patient able to flex knees: Yes (08/17/19 1445)  Caren Macadam

## 2019-08-17 NOTE — H&P (Signed)
The patient understands we are proceeding with surgery today.  His right operative hip is marked.  There is been no change in his medical status and his H&P was done yesterday.  The risk and benefits of surgery been explained.

## 2019-08-17 NOTE — Evaluation (Signed)
Physical Therapy Evaluation Patient Details Name: Jonathan Navarro MRN: [id number] DOB: Dec 02, 1945 Today's Date: 08/17/2019   History of Present Illness  Patient is 74 y.o. male s/p Rt THA anterior approach on 08/17/19 with PMH significant for HTN, OA, bil RCR.  Clinical Impression  Jonathan Navarro is a 74 y.o. male POD 0 s/p Rt THA. Patient reports independence with mobility at baseline. Patient is now limited by functional impairments (see PT problem list below) and requires mod assist for transfers and gait with RW. Patient was able to ambulate ~6 feet with RW and mod assist and was greatly limited by pain this session. Patient will benefit from continued skilled PT interventions to address impairments and progress towards PLOF. Acute PT will follow to progress mobility and stair training in preparation for safe discharge home.     Follow Up Recommendations Follow surgeon's recommendation for DC plan and follow-up therapies    Equipment Recommendations       Recommendations for Other Services       Precautions / Restrictions Precautions Precautions: Fall Restrictions Weight Bearing Restrictions: No Other Position/Activity Restrictions: WBAT      Mobility  Bed Mobility Overal bed mobility: Needs Assistance Bed Mobility: Supine to Sit;Sit to Supine     Supine to sit: Mod assist;HOB elevated Sit to supine: Max assist;+2 for physical assistance   General bed mobility comments: pt required mod assist for LE mobility to bring LE's to EOB and to pivot/scoot forward. Max assist +2 required to return to supine due to pain, assist to raise LE's into bed and control lowering trunk.   Transfers Overall transfer level: Needs assistance Equipment used: Rolling walker (2 wheeled) Transfers: Sit to/from [employer] Sit to Stand: Mod assist;From elevated surface Stand pivot transfers: Mod assist       General transfer comment: cues for hand placement with RW, mod assist  required to initiate and complete power up. pt unsteady in standing. Pt requried mod assist +2 for power up from recliner and assist to manage walker during stand step/pivot to return to bed due to substantial discomfort in recliner.   Ambulation/Gait Ambulation/Gait assistance: +2 safety/equipment;Min assist Gait Distance (Feet): 6 Feet Assistive device: Rolling walker (2 wheeled) Gait Pattern/deviations: Step-to pattern;Decreased stride length;Decreased step length - left;Decreased step length - right;Decreased stance time - right;Shuffle;Decreased weight shift to right Gait velocity: decreased   General Gait Details: verbal cues and assist to correct pt for hand placement on RW, cues for step pattern and pt taking small steps with decreased hip/knee flexion bil LE's. Pt heavily reliant on UE support due to pain in Rt hip and decreased trust in Lt hip as he has experienced buckling in Lt LE previously. pt limited by pain and seat provided after several short steps in room.   Stairs         Wheelchair Mobility    Modified Rankin (Stroke Patients Only)       Balance Overall balance assessment: Needs assistance Sitting-balance support: Feet supported Sitting balance-Leahy Scale: Fair     Standing balance support: Bilateral upper extremity supported;During functional activity Standing balance-Leahy Scale: Poor            Pertinent Vitals/Pain Pain Assessment: 0-10 Pain Score: 8  Pain Location: Rt hip Pain Descriptors / Indicators: Aching;Burning Pain Intervention(s): Limited activity within patient's tolerance;Monitored during session;Repositioned;Ice applied;Premedicated before session    Home Living Family/patient expects to be discharged to:: Private residence Living Arrangements: Spouse/significant other Available Help at Discharge: Family Type of  Home: House Home Access: Stairs to enter Entrance Stairs-Rails: None Entrance Stairs-Number of Steps: 1 Home Layout:  One level;Laundry or work area in [place]: Environmental consultant - 2 wheels;Cane - single point;Bedside commode      Prior Function Level of Independence: Independent               Hand Dominance   Dominant Hand: Right    Extremity/Trunk Assessment   Upper Extremity Assessment Upper Extremity Assessment: Overall WFL for tasks assessed    Lower Extremity Assessment Lower Extremity Assessment: Generalized weakness;RLE deficits/detail RLE Deficits / Details: pt with good quad activaion, 4-/5 for MMT RLE: Unable to fully assess due to pain RLE Sensation: WNL RLE Coordination: WNL    Cervical / Trunk Assessment Cervical / Trunk Assessment: Normal  Communication   Communication: No difficulties  Cognition Arousal/Alertness: Awake/alert Behavior During Therapy: WFL for tasks assessed/performed Overall Cognitive Status: Within Functional Limits for tasks assessed           General Comments      Exercises     Assessment/Plan    PT Assessment Patient needs continued PT services  PT Problem List Decreased strength;Decreased range of motion;Decreased balance;Decreased activity tolerance;Decreased mobility;Decreased knowledge of use of DME;Obesity;Pain       PT Treatment Interventions DME instruction;Gait training;Stair training;Therapeutic activities;Functional mobility training;Therapeutic exercise;Balance training;Patient/family education    PT Goals (Current goals can be found in the Care Plan section)  Acute Rehab PT Goals Patient Stated Goal: to get back to independent levela nd get Lt hip done as well PT Goal Formulation: With patient Time For Goal Achievement: 08/24/19 Potential to Achieve Goals: Good    Frequency 7X/week    AM-PAC PT "6 Clicks" Mobility  Outcome Measure Help needed turning from your back to your side while in a flat bed without using bedrails?: A Lot Help needed moving from lying on your back to sitting on the side of a flat bed  without using bedrails?: A Lot Help needed moving to and from a bed to a chair (including a wheelchair)?: A Lot Help needed standing up from a chair using your arms (e.g., wheelchair or bedside chair)?: A Lot Help needed to walk in hospital room?: A Lot Help needed climbing 3-5 steps with a railing? : Total 6 Click Score: 11    End of Session Equipment Utilized During Treatment: Gait belt Activity Tolerance: Patient limited by pain Patient left: in bed;with call bell/phone within reach;with bed alarm set;with family/visitor present Nurse Communication: Mobility status PT Visit Diagnosis: Muscle weakness (generalized) (M62.81);Difficulty in walking, not elsewhere classified (R26.2);Pain Pain - Right/Left: Right Pain - part of body: Hip    Time: 0272-5366 PT Time Calculation (min) (ACUTE ONLY): 38 min   Charges:   PT Evaluation $PT Eval Low Complexity: 1 Low PT Treatments $Gait Training: 8-22 mins $Therapeutic Activity: 8-22 mins       Verner Mould, DPT Physical Therapist with [facility] [phone]  08/17/2019 6:35 PM

## 2019-08-17 NOTE — Op Note (Signed)
NAMEZayn Navarro, Jonathan Navarro MEDICAL RECORD WU:[id number] ACCOUNT [id number] DATE OF BIRTH:01-14-1946 FACILITY: [facility] LOCATION: [facility] PHYSICIAN:Avaeh Ewer Aretha Parrot, MD  OPERATIVE REPORT  DATE OF PROCEDURE:  08/17/2019  PREOPERATIVE DIAGNOSIS:  Primary osteoarthritis and degenerative joint disease, right hip.  POSTOPERATIVE DIAGNOSIS:  Primary osteoarthritis and degenerative joint disease, right hip.  PROCEDURE:  Right total hip arthroplasty through direct anterior approach.  IMPLANTS:  DePuy Sector Gription acetabular component size 56, size 36+4 neutral polyethylene liner, size 14 Corail femoral component with standard offset, size 36+1.5 metal hip ball.  SURGEON:  Vanita Panda.  Magnus Ivan, MD  ASSISTANT:  Richardean Canal, PA-C.  ANESTHESIA:  Spinal.  ANTIBIOTICS:  Two g IV Ancef.  ESTIMATED BLOOD LOSS:  300 mL.  COMPLICATIONS:  None.  INDICATIONS:  The patient is a very pleasant 74 year old gentleman well known to me.  He actually has debilitating arthritis involving both his hips.  His left hip looks worse radiographically than the right.  The right hip does show severe arthritis.   His right hip is much more painful to him.  At this point, he wished to proceed with a total hip arthroplasty on the right side given the failure of conservative treatment and his x-ray findings.  At this point, his right hip pain is detrimentally  affecting his mobility, his quality of life and his activities of daily living.  We have talked about hip replacement surgery in detail.  I described the risks and benefits of surgery including the risk of acute blood loss anemia, nerve or vessel injury,  fracture, infection, dislocation, DVT and implant failure.  We talked about our goals being decreased pain, improved mobility and overall improved quality of life.  DESCRIPTION OF PROCEDURE:  After informed consent was obtained and appropriate right hip was marked, he was brought to the operating room and sat  up on the stretcher where spinal anesthesia was then obtained.  He was laid in the supine position on a  stretcher so I was able to get a good assessment of his leg lengths.  A Foley catheter was then placed.  Of note, he is longer on his right side as he should be since his left side is definitely much more diseased.  Traction boots were placed on both his  feet.  Next, he was placed supine on the Hana fracture table with the perineal post in place and both legs in line skeletal traction device and no traction applied.  His right operative hip was then prepped and draped with DuraPrep and sterile drapes.   A time-out was called.  He was identified as correct patient, correct right hip.  I then made an incision just inferior and posterior to the anterior superior iliac spine and carried this only slightly obliquely down the leg.  I dissected down to the  tensor fascia lata muscle.  Tensor fascia was then divided longitudinally to proceed with direct anterior approach to the hip.  We identified and cauterized the circumflex vessels and identified the hip capsule, opened up the hip capsule in an L-type  format, finding a moderate joint effusion and significant periarticular osteophytes around the lateral femoral head and neck.  I placed Cobra retractors within the joint capsule around the medial and lateral femoral neck and then we made our femoral neck  cut with an oscillating saw and completed this with an osteotome.  This cut just proximal to the lesser trochanter.  I then placed a corkscrew guide in the femoral head and removed the femoral head  in its entirety and found a wide area devoid of  cartilage.  I then placed a bent Hohmann over the medial acetabular rim and removed remnants of the acetabular labrum and other debris.  We then began reaming under direct visualization from a size 44 reamer in stepwise increments up to a size 55, with  all reamers under direct visualization, the last reamer under  direct fluoroscopy, so I could obtain our depth of reaming, my inclination and anteversion.  I then placed the real DePuy Sector Gription acetabular component size 56 and then we went with a  36+4 polyethylene liner.  Attention was then turned to the femur.  With the leg externally rotated to 120 degrees, extended and adducted, we were able to place a Mueller retractor medially and a  Hohman retractor behind the greater trochanter.  I  released lateral joint capsule and used a box-cutting osteotome to enter the femoral canal and a rongeur to lateralize.  I then began broaching using the Corail broaching system from a size 8 up to a size 14.  This is what we had templated as well.  With  this 14 in place, based off his anatomy, we trialed a standard offset femoral neck and a 36+1.5 hip ball.  We brought the leg back over and up and with traction and internal rotation, reducing the pelvis.  We were pleased with his leg length, offset,  range of motion and stability assessed radiographically and mechanically.  We then dislocated the hip and removed the trial components.  I placed the real standard offset Corail femoral component and the real 36+1.5 metal hip ball.  We again reduced this  in the acetabulum.  We were pleased with stability.  We then irrigated the soft tissue with normal saline solution using pulsatile lavage.  We closed the joint capsule with interrupted #1 Ethibond suture, followed by closing the tensor fascia with #1  Vicryl.  Zero Vicryl was used to close deep tissue, 2-0 Vicryl was used to close the subcutaneous tissue.  The skin was reapproximated with staples.  Xeroform and Aquacel dressing was applied.  He was taken off the Hana table and taken to the recovery  room in stable condition.  All final counts were correct.  There were no complications noted.  Of note, Benita Stabile, PA-C, assisted during the entire case and his assistance was crucial for facilitating all aspects of this  case.  VN/NUANCE  D:08/17/2019 T:08/17/2019 JOB:[id number]

## 2019-08-17 NOTE — Discharge Instructions (Addendum)
Information on my medicine - XARELTO (Rivaroxaban) Why was Xarelto prescribed for you? Xarelto was prescribed for you to reduce the risk of blood clots forming after orthopedic surgery. The medical term for these abnormal blood clots is venous thromboembolism (VTE).  What do you need to know about xarelto ? Take your Xarelto ONCE DAILY at the same time every day. You may take it either with or without food.  If you have difficulty swallowing the tablet whole, you may crush it and mix in applesauce just prior to taking your dose.  Take Xarelto exactly as prescribed by your doctor and DO NOT stop taking Xarelto without talking to the doctor who prescribed the medication.  Stopping without other VTE prevention medication to take the place of Xarelto may increase your risk of developing a clot.  After discharge, you should have regular check-up appointments with your healthcare provider that is prescribing your Xarelto.    What do you do if you miss a dose? If you miss a dose, take it as soon as you remember on the same day then continue your regularly scheduled once daily regimen the next day. Do not take two doses of Xarelto on the same day.   Important Safety Information A possible side effect of Xarelto is bleeding. You should call your healthcare provider right away if you experience any of the following: ? Bleeding from an injury or your nose that does not stop. ? Unusual colored urine (red or dark brown) or unusual colored stools (red or black). ? Unusual bruising for unknown reasons. ? A serious fall or if you hit your head (even if there is no bleeding).  Some medicines may interact with Xarelto and might increase your risk of bleeding while on Xarelto. To help avoid this, consult your healthcare provider or pharmacist prior to using any new prescription or non-prescription medications, including herbals, vitamins, non-steroidal anti-inflammatory drugs (NSAIDs) and  supplements.  This website has more information on Xarelto: www.xarelto.com.  INSTRUCTIONS AFTER JOINT REPLACEMENT   o Remove items at home which could result in a fall. This includes throw rugs or furniture in walking pathways o ICE to the affected joint every three hours while awake for 30 minutes at a time, for at least the first 3-5 days, and then as needed for pain and swelling.  Continue to use ice for pain and swelling. You may notice swelling that will progress down to the foot and ankle.  This is normal after surgery.  Elevate your leg when you are not up walking on it.   o Continue to use the breathing machine you got in the hospital (incentive spirometer) which will help keep your temperature down.  It is common for your temperature to cycle up and down following surgery, especially at night when you are not up moving around and exerting yourself.  The breathing machine keeps your lungs expanded and your temperature down.   DIET:  As you were doing prior to hospitalization, we recommend a well-balanced diet.  DRESSING / WOUND CARE / SHOWERING  Keep the surgical dressing until follow up.  The dressing is water proof, so you can shower without any extra covering.  IF THE DRESSING FALLS OFF or the wound gets wet inside, change the dressing with sterile gauze.  Please use good hand washing techniques before changing the dressing.  Do not use any lotions or creams on the incision until instructed by your surgeon.    ACTIVITY  o Increase activity slowly as   tolerated, but follow the weight bearing instructions below.   o No driving for 6 weeks or until further direction given by your physician.  You cannot drive while taking narcotics.  o No lifting or carrying greater than 10 lbs. until further directed by your surgeon. o Avoid periods of inactivity such as sitting longer than an hour when not asleep. This helps prevent blood clots.  o You may return to work once you are authorized by  your doctor.     WEIGHT BEARING   Weight bearing as tolerated with assist device (walker, cane, etc) as directed, use it as long as suggested by your surgeon or therapist, typically at least 4-6 weeks.   EXERCISES  Results after joint replacement surgery are often greatly improved when you follow the exercise, range of motion and muscle strengthening exercises prescribed by your doctor. Safety measures are also important to protect the joint from further injury. Any time any of these exercises cause you to have increased pain or swelling, decrease what you are doing until you are comfortable again and then slowly increase them. If you have problems or questions, call your caregiver or physical therapist for advice.   Rehabilitation is important following a joint replacement. After just a few days of immobilization, the muscles of the leg can become weakened and shrink (atrophy).  These exercises are designed to build up the tone and strength of the thigh and leg muscles and to improve motion. Often times heat used for twenty to thirty minutes before working out will loosen up your tissues and help with improving the range of motion but do not use heat for the first two weeks following surgery (sometimes heat can increase post-operative swelling).   These exercises can be done on a training (exercise) mat, on the floor, on a table or on a bed. Use whatever works the best and is most comfortable for you.    Use music or television while you are exercising so that the exercises are a pleasant break in your day. This will make your life better with the exercises acting as a break in your routine that you can look forward to.   Perform all exercises about fifteen times, three times per day or as directed.  You should exercise both the operative leg and the other leg as well.  Exercises include:   . Quad Sets - Tighten up the muscle on the front of the thigh (Quad) and hold for 5-10 seconds.    . Straight Leg Raises - With your knee straight (if you were given a brace, keep it on), lift the leg to 60 degrees, hold for 3 seconds, and slowly lower the leg.  Perform this exercise against resistance later as your leg gets stronger.  . Leg Slides: Lying on your back, slowly slide your foot toward your buttocks, bending your knee up off the floor (only go as far as is comfortable). Then slowly slide your foot back down until your leg is flat on the floor again.  . Angel Wings: Lying on your back spread your legs to the side as far apart as you can without causing discomfort.  . Hamstring Strength:  Lying on your back, push your heel against the floor with your leg straight by tightening up the muscles of your buttocks.  Repeat, but this time bend your knee to a comfortable angle, and push your heel against the floor.  You may put a pillow under the heel to make it more   comfortable if necessary.   A rehabilitation program following joint replacement surgery can speed recovery and prevent re-injury in the future due to weakened muscles. Contact your doctor or a physical therapist for more information on knee rehabilitation.    CONSTIPATION  Constipation is defined medically as fewer than three stools per week and severe constipation as less than one stool per week.  Even if you have a regular bowel pattern at home, your normal regimen is likely to be disrupted due to multiple reasons following surgery.  Combination of anesthesia, postoperative narcotics, change in appetite and fluid intake all can affect your bowels.   YOU MUST use at least one of the following options; they are listed in order of increasing strength to get the job done.  They are all available over the counter, and you may need to use some, POSSIBLY even all of these options:    Drink plenty of fluids (prune juice may be helpful) and high fiber foods Colace 100 mg by mouth twice a day  Senokot for constipation as directed and as  needed Dulcolax (bisacodyl), take with full glass of water  Miralax (polyethylene glycol) once or twice a day as needed.  If you have tried all these things and are unable to have a bowel movement in the first 3-4 days after surgery call either your surgeon or your primary doctor.    If you experience loose stools or diarrhea, hold the medications until you stool forms back up.  If your symptoms do not get better within 1 week or if they get worse, check with your doctor.  If you experience "the worst abdominal pain ever" or develop nausea or vomiting, please contact the office immediately for further recommendations for treatment.   ITCHING:  If you experience itching with your medications, try taking only a single pain pill, or even half a pain pill at a time.  You can also use Benadryl over the counter for itching or also to help with sleep.   TED HOSE STOCKINGS:  Use stockings on both legs until for at least 2 weeks or as directed by physician office. They may be removed at night for sleeping.  MEDICATIONS:  See your medication summary on the "After Visit Summary" that nursing will review with you.  You may have some home medications which will be placed on hold until you complete the course of blood thinner medication.  It is important for you to complete the blood thinner medication as prescribed.  PRECAUTIONS:  If you experience chest pain or shortness of breath - call 911 immediately for transfer to the hospital emergency department.   If you develop a fever greater that 101 F, purulent drainage from wound, increased redness or drainage from wound, foul odor from the wound/dressing, or calf pain - CONTACT YOUR SURGEON.                                                   FOLLOW-UP APPOINTMENTS:  If you do not already have a post-op appointment, please call the office for an appointment to be seen by your surgeon.  Guidelines for how soon to be seen are listed in your "After Visit Summary", but  are typically between 1-4 weeks after surgery.  OTHER INSTRUCTIONS:   Knee Replacement:  Do not place pillow under knee, focus on keeping   the knee straight while resting. CPM instructions: 0-90 degrees, 2 hours in the morning, 2 hours in the afternoon, and 2 hours in the evening. Place foam block, curve side up under heel at all times except when in CPM or when walking.  DO NOT modify, tear, cut, or change the foam block in any way.   DENTAL ANTIBIOTICS:  In most cases prophylactic antibiotics for Dental procdeures after total joint surgery are not necessary.  Exceptions are as follows:  1. History of prior total joint infection  2. Severely immunocompromised (Organ Transplant, cancer chemotherapy, Rheumatoid biologic meds such as Humera)  3. Poorly controlled diabetes (A1C &gt; 8.0, blood glucose over 200)  If you have one of these conditions, contact your surgeon for an antibiotic prescription, prior to your dental procedure.   MAKE SURE YOU:  . Understand these instructions.  . Get help right away if you are not doing well or get worse.    Thank you for letting us be a part of your medical care team.  It is a privilege we respect greatly.  We hope these instructions will help you stay on track for a fast and full recovery!   

## 2019-08-17 NOTE — Anesthesia Procedure Notes (Signed)
Procedure Name: MAC Date/Time: 08/17/2019 11:41 AM Performed by: Deliah Boston, CRNA Pre-anesthesia Checklist: Patient identified, Emergency Drugs available, Suction available and Patient being monitored Patient Re-evaluated:Patient Re-evaluated prior to induction Oxygen Delivery Method: Simple face mask Preoxygenation: Pre-oxygenation with 100% oxygen Placement Confirmation: breath sounds checked- equal and bilateral and positive ETCO2

## 2019-08-17 NOTE — Transfer of Care (Signed)
Immediate Anesthesia Transfer of Care Note  Patient: Jonathan Navarro  Procedure(s) Performed: Procedure(s): RIGHT TOTAL HIP ARTHROPLASTY ANTERIOR APPROACH (Right)  Patient Location: PACU  Anesthesia Type:MAC and Spinal  Level of Consciousness: Patient easily awoken, sedated, comfortable, cooperative, following commands, responds to stimulation.   Airway & Oxygen Therapy: Patient spontaneously breathing, ventilating well, oxygen via simple oxygen mask.  Post-op Assessment: Report given to PACU RN, vital signs reviewed and stable.   Post vital signs: Reviewed and stable.  Complications: No apparent anesthesia complications Last Vitals:  Vitals Value Taken Time  BP 152/90 08/17/19 1309  Temp    Pulse 61 08/17/19 1312  Resp 10 08/17/19 1312  SpO2 97 % 08/17/19 1312  Vitals shown include unvalidated device data.  Last Pain:  Vitals:   08/17/19 0923  TempSrc:   PainSc: 3       Patients Stated Pain Goal: 3 (78/67/67 2094)  Complications: No apparent anesthesia complications

## 2019-08-17 NOTE — Anesthesia Preprocedure Evaluation (Signed)
Anesthesia Evaluation  Patient identified by MRN, date of birth, ID band Patient awake    Reviewed: Allergy & Precautions, NPO status , Patient's Chart, lab work & pertinent test results, reviewed documented beta blocker date and time   Airway Mallampati: I       Dental no notable dental hx. (+) Teeth Intact   Pulmonary neg pulmonary ROS, former smoker,    Pulmonary exam normal breath sounds clear to auscultation       Cardiovascular hypertension, Pt. on medications and Pt. on home beta blockers Normal cardiovascular exam Rhythm:Regular Rate:Normal     Neuro/Psych negative neurological ROS  negative psych ROS   GI/Hepatic Neg liver ROS, GERD  Medicated and Controlled,  Endo/Other  negative endocrine ROS  Renal/GU negative Renal ROS  negative genitourinary   Musculoskeletal   Abdominal (+) + obese,   Peds  Hematology negative hematology ROS (+)   Anesthesia Other Findings   Reproductive/Obstetrics                             Anesthesia Physical Anesthesia Plan  ASA: II  Anesthesia Plan: Spinal   Post-op Pain Management:    Induction:   PONV Risk Score and Plan: 2 and Ondansetron and Dexamethasone  Airway Management Planned: Natural Airway, Nasal Cannula and Simple Face Mask  Additional Equipment: None  Intra-op Plan:   Post-operative Plan:   Informed Consent: I have reviewed the patients History and Physical, chart, labs and discussed the procedure including the risks, benefits and alternatives for the proposed anesthesia with the patient or authorized representative who has indicated his/her understanding and acceptance.       Plan Discussed with: CRNA  Anesthesia Plan Comments:         Anesthesia Quick Evaluation

## 2019-08-18 DIAGNOSIS — M160 Bilateral primary osteoarthritis of hip: Secondary | ICD-10-CM | POA: Diagnosis not present

## 2019-08-18 LAB — CBC
HCT: 40.3 % (ref 39.0–52.0)
Hemoglobin: 13.7 g/dL (ref 13.0–17.0)
MCH: 30.9 pg (ref 26.0–34.0)
MCHC: 34 g/dL (ref 30.0–36.0)
MCV: 90.8 fL (ref 80.0–100.0)
Platelets: 159 10*3/uL (ref 150–400)
RBC: 4.44 MIL/uL (ref 4.22–5.81)
RDW: 11.7 % (ref 11.5–15.5)
WBC: 14.1 10*3/uL — ABNORMAL HIGH (ref 4.0–10.5)
nRBC: 0 % (ref 0.0–0.2)

## 2019-08-18 LAB — BASIC METABOLIC PANEL
Anion gap: 10 (ref 5–15)
BUN: 21 mg/dL (ref 8–23)
CO2: 24 mmol/L (ref 22–32)
Calcium: 8.8 mg/dL — ABNORMAL LOW (ref 8.9–10.3)
Chloride: 104 mmol/L (ref 98–111)
Creatinine, Ser: 1.17 mg/dL (ref 0.61–1.24)
GFR calc Af Amer: >60 mL/min (ref >60)
GFR calc non Af Amer: >60 mL/min (ref >60)
Glucose, Bld: 135 mg/dL — ABNORMAL HIGH (ref 70–99)
Potassium: 4.7 mmol/L (ref 3.5–5.1)
Sodium: 138 mmol/L (ref 135–145)

## 2019-08-18 MED ORDER — RIVAROXABAN 10 MG PO TABS: 10.0000 mg | ORAL_TABLET | Freq: Every day | ORAL | 0 refills | Status: DC

## 2019-08-18 MED ORDER — OXYCODONE HCL 5 MG PO TABS: 5.0000 mg | ORAL_TABLET | ORAL | 0 refills | Status: DC | PRN

## 2019-08-18 MED ORDER — METHOCARBAMOL 500 MG PO TABS: 500.0000 mg | ORAL_TABLET | Freq: Four times a day (QID) | ORAL | 0 refills | Status: DC | PRN

## 2019-08-18 MED ORDER — DILTIAZEM HCL ER COATED BEADS 180 MG PO CP24
360.0000 mg | ORAL_CAPSULE | Freq: Every day | ORAL | Status: DC
  Administered 2019-08-18: 360 mg via ORAL
  Filled 2019-08-18: qty 2

## 2019-08-18 NOTE — TOC Progression Note (Signed)
Transition of Care [facility]) - Progression Note    Patient Details  Name: Jonathan Navarro MRN: [id number] Date of Birth: 1946-02-17  Transition of Care [facility]) CM/SW Contact  Armanda Heritage, RN Phone Number: 08/18/2019, 11:14 AM  Clinical Narrative:  CM spoke with patient at bedside. Patient reports he would like to go to outpatient PT at therapy direct in [place], [place].  Patient has rolling walker and 3in1 at home.     Expected Discharge Plan: Home/Self Care Barriers to Discharge: No Barriers Identified  Expected Discharge Plan and Services Expected Discharge Plan: Home/Self Care   Discharge Planning Services: CM Consult   Living arrangements for the past 2 months: Single Family Home Expected Discharge Date: 08/18/19               DME Arranged: N/A DME Agency: NA       HH Arranged: NA HH Agency: NA         Social Determinants of Health (SDOH) Interventions    Readmission Risk Interventions No flowsheet data found.

## 2019-08-18 NOTE — Progress Notes (Signed)
Physical Therapy Treatment Patient Details Name: Jonathan Navarro MRN: [id number] DOB: August 04, 1945 Today's Date: 08/18/2019    History of Present Illness Patient is 74 y.o. male s/p Rt THA anterior approach on 08/17/19 with PMH significant for HTN, OA, bil RCR.    PT Comments    Progressing with mobility. Will plan to have a 2nd session prior to d/c home later today.    Follow Up Recommendations  Follow surgeon's recommendation for DC plan and follow-up therapies     Equipment Recommendations  None recommended by PT    Recommendations for Other Services       Precautions / Restrictions Precautions Precautions: Fall Restrictions Weight Bearing Restrictions: No Other Position/Activity Restrictions: WBAT    Mobility  Bed Mobility Overal bed mobility: Needs Assistance Bed Mobility: Supine to Sit     Supine to sit: Min assist;HOB elevated     General bed mobility comments: Assist for R LE. Increased time.  Transfers Overall transfer level: Needs assistance Equipment used: Rolling walker (2 wheeled) Transfers: Sit to/from Stand Sit to Stand: Min assist;From elevated surface         General transfer comment: Assist to rise, stabilize, control descent. VCs safety, hand placement.  Ambulation/Gait Ambulation/Gait assistance: Min guard Gait Distance (Feet): 135 Feet Assistive device: Rolling walker (2 wheeled) Gait Pattern/deviations: Step-to pattern;Step-through pattern;Decreased stride length     General Gait Details: close guard for safety. slow gait speed. pt had to stop every few steps to flex knee to stretch front of thigh a bit   Stairs             Wheelchair Mobility    Modified Rankin (Stroke Patients Only)       Balance Overall balance assessment: Needs assistance         Standing balance support: Bilateral upper extremity supported Standing balance-Leahy Scale: Fair                              Cognition Arousal/Alertness:  Awake/alert Behavior During Therapy: WFL for tasks assessed/performed Overall Cognitive Status: Within Functional Limits for tasks assessed                                        Exercises Total Joint Exercises Knee Flexion: AROM;Right;10 reps;Standing(3)    General Comments        Pertinent Vitals/Pain Pain Assessment: 0-10 Pain Score: 6  Pain Location: R hip/thigh Pain Descriptors / Indicators: Sore;Aching Pain Intervention(s): Monitored during session;Repositioned;Ice applied    Home Living                      Prior Function            PT Goals (current goals can now be found in the care plan section) Progress towards PT goals: Progressing toward goals    Frequency    7X/week      PT Plan Current plan remains appropriate    Co-evaluation              AM-PAC PT "6 Clicks" Mobility   Outcome Measure  Help needed turning from your back to your side while in a flat bed without using bedrails?: A Little Help needed moving from lying on your back to sitting on the side of a flat bed without using bedrails?: A Little Help needed moving to  and from a bed to a chair (including a wheelchair)?: A Little Help needed standing up from a chair using your arms (e.g., wheelchair or bedside chair)?: A Little Help needed to walk in hospital room?: A Little Help needed climbing 3-5 steps with a railing? : A Little 6 Click Score: 18    End of Session Equipment Utilized During Treatment: Gait belt Activity Tolerance: Patient tolerated treatment well Patient left: in chair;with call bell/phone within reach;with family/visitor present   PT Visit Diagnosis: Muscle weakness (generalized) (M62.81);Difficulty in walking, not elsewhere classified (R26.2);Pain Pain - Right/Left: Right Pain - part of body: Hip     Time: 9767-3419 PT Time Calculation (min) (ACUTE ONLY): 20 min  Charges:  $Gait Training: 8-22 mins              Doreatha Massed,  PT Acute Rehabilitation

## 2019-08-18 NOTE — Discharge Summary (Signed)
Patient ID: Oluwatimilehin Balfour MRN: [id number] DOB/AGE: January 01, 1946 74 y.o.  Admit date: 08/17/2019 Discharge date: 08/18/2019  Admission Diagnoses:  Principal Problem:   Unilateral primary osteoarthritis, right hip Active Problems:   Status post total replacement of right hip   Discharge Diagnoses:  Same  Past Medical History:  Diagnosis Date  . Arthritis   . Hypertension     Surgeries: Procedure(s): RIGHT TOTAL HIP ARTHROPLASTY ANTERIOR APPROACH on 08/17/2019   Consultants:   Discharged Condition: Improved  Hospital Course: Dewaun Kinzler is an 74 y.o. male who was admitted 08/17/2019 for operative treatment ofUnilateral primary osteoarthritis, right hip. Patient has severe unremitting pain that affects sleep, daily activities, and work/hobbies. After pre-op clearance the patient was taken to the operating room on 08/17/2019 and underwent  Procedure(s): RIGHT TOTAL HIP ARTHROPLASTY ANTERIOR APPROACH.    Patient was given perioperative antibiotics:  Anti-infectives (From admission, onward)   Start     Dose/Rate Route Frequency Ordered Stop   08/17/19 1800  ceFAZolin (ANCEF) IVPB 1 g/50 mL premix     1 g 100 mL/hr over 30 Minutes Intravenous Every 6 hours 08/17/19 1530 08/18/19 0152   08/17/19 0915  ceFAZolin (ANCEF) IVPB 2g/100 mL premix     2 g 200 mL/hr over 30 Minutes Intravenous On call to O.R. 08/17/19 0904 08/17/19 1148       Patient was given sequential compression devices, early ambulation, and chemoprophylaxis to prevent DVT.  Patient benefited maximally from hospital stay and there were no complications.    Recent vital signs:  Patient Vitals for the past 24 hrs:  BP Temp Temp src Pulse Resp SpO2  08/18/19 0935 (!) 150/67 98 F (36.7 C) Oral (!) 57 16 93 %  08/18/19 0559 138/84 98 F (36.7 C) Oral 74 16 95 %  08/18/19 0055 (!) 149/85 97.6 F (36.4 C) Oral 79 16 96 %  08/17/19 2204 126/83 98 F (36.7 C) Oral 81 16 95 %  08/17/19 1757 (!) 145/90 -- -- 76 15 95 %   08/17/19 1542 (!) 151/88 98.2 F (36.8 C) Oral 67 14 95 %  08/17/19 1445 131/80 97.6 F (36.4 C) -- (!) 53 (!) 8 95 %  08/17/19 1430 136/82 -- -- 60 11 93 %  08/17/19 1415 (!) 146/86 -- -- (!) 56 (!) 8 91 %  08/17/19 1400 (!) 144/88 -- -- 64 13 94 %  08/17/19 1345 (!) 156/92 -- -- (!) 56 10 93 %  08/17/19 1330 (!) 143/75 -- -- (!) 57 (!) 8 97 %  08/17/19 1315 138/90 97.6 F (36.4 C) -- 62 10 97 %     Recent laboratory studies:  Recent Labs    08/18/19 0338  WBC 14.1*  HGB 13.7  HCT 40.3  PLT 159  NA 138  K 4.7  CL 104  CO2 24  BUN 21  CREATININE 1.17  GLUCOSE 135*  CALCIUM 8.8*     Discharge Medications:   Allergies as of 08/18/2019      Reactions   Codeine Nausea Only   Nsaids    Severe irregular heartbeat      Medication List    STOP taking these medications   aspirin 81 MG EC tablet   HYDROcodone-acetaminophen 7.5-325 MG tablet Commonly known as: NORCO     TAKE these medications   ALPRAZolam 1 MG tablet Commonly known as: XANAX Take 1 mg by mouth 2 (two) times daily as needed for anxiety.   Cholecalciferol 50 MCG (2000 UT) Caps  Take 2,000 Units by mouth daily.   cholestyramine 4 GM/DOSE powder Commonly known as: QUESTRAN Take 4 g by mouth daily as needed.   diltiazem 360 MG 24 hr capsule Commonly known as: TIAZAC Take 360 mg by mouth daily.   esomeprazole 40 MG capsule Commonly known as: NEXIUM Take 40 mg by mouth daily before breakfast.   folic acid 400 MCG tablet Commonly known as: FOLVITE Take 400 mcg by mouth daily.   methocarbamol 500 MG tablet Commonly known as: ROBAXIN Take 1 tablet (500 mg total) by mouth every 6 (six) hours as needed for muscle spasms.   metoprolol tartrate 50 MG tablet Commonly known as: LOPRESSOR Take 50 mg by mouth 2 (two) times daily.   MULTIVITAMIN ADULT PO Take 1 tablet by mouth daily.   OVER THE COUNTER MEDICATION Take 1 capsule by mouth daily. pyrroloquinoline quinone (PQQ)   oxyCODONE 5 MG  immediate release tablet Commonly known as: Oxy IR/ROXICODONE Take 1-2 tablets (5-10 mg total) by mouth every 4 (four) hours as needed for moderate pain (pain score 4-6).   rivaroxaban 10 MG Tabs tablet Commonly known as: XARELTO Take 1 tablet (10 mg total) by mouth daily with breakfast. Start taking on: August 19, 2019   sucralfate 1 g tablet Commonly known as: CARAFATE Take 1 g by mouth 3 (three) times daily as needed (stomach acid).            Durable Medical Equipment  (From admission, onward)         Start     Ordered   08/17/19 1530  DME 3 n 1  Once     08/17/19 1530   08/17/19 1530  DME Walker rolling  Once    Question Answer Comment  Walker: With 5 Inch Wheels   Patient needs a walker to treat with the following condition Status post total replacement of right hip      08/17/19 1530          Diagnostic Studies: DG Pelvis Portable  Result Date: 08/17/2019 CLINICAL DATA:  Status post right hip replacement EXAM: PORTABLE PELVIS 1-2 VIEWS COMPARISON:  Intraoperative films from earlier in the same day FINDINGS: Right hip prosthesis is noted in satisfactory position. No acute bony or soft tissue abnormality is noted. Degenerative changes of the left hip joint are seen. IMPRESSION: Status post right hip replacement Electronically Signed   By: Alcide Clever M.D.   On: 08/17/2019 14:24   DG C-Arm 1-60 Min-No Report  Result Date: 08/17/2019 Fluoroscopy was utilized by the requesting physician.  No radiographic interpretation.   DG HIP OPERATIVE UNILAT W OR W/O PELVIS RIGHT  Result Date: 08/17/2019 CLINICAL DATA:  Right hip replacement EXAM: OPERATIVE RIGHT HIP WITH PELVIS COMPARISON:  None. FLUOROSCOPY TIME:  Radiation Exposure Index (as provided by the fluoroscopic device): 2.36 mGy If the device does not provide the exposure index: Fluoroscopy Time:  12 seconds Number of Acquired Images:  2 FINDINGS: Right hip prosthesis is noted in satisfactory position. No acute bony or  soft tissue abnormality is noted. Degenerative changes of the left hip joint are again noted. IMPRESSION: Status post right hip replacement. Electronically Signed   By: Alcide Clever M.D.   On: 08/17/2019 13:20   XR HIP UNILAT W OR W/O PELVIS 1V RIGHT  Result Date: 07/25/2019 A low AP pelvis and lateral right hip shows severe end-stage arthritis of both hips.  There is complete loss of joint space.  There are large periarticular osteophytes.  There is flattening of both femoral heads and severe sclerotic changes.   Disposition: Discharge disposition: 01-Home or Self Care         Follow-up Information    Mcarthur Rossetti, MD Follow up in 2 week(s).   Specialty: Orthopedic Surgery Contact information: [address] [place] [address] [phone]            Signed: Mcarthur Rossetti 08/18/2019, 10:07 AM

## 2019-08-18 NOTE — Progress Notes (Signed)
Subjective: 1 Day Post-Op Procedure(s) (LRB): RIGHT TOTAL HIP ARTHROPLASTY ANTERIOR APPROACH (Right) Patient reports pain as moderate.    Objective: Vital signs in last 24 hours: Temp:  [97.6 F (36.4 C)-98.2 F (36.8 C)] 98 F (36.7 C) (03/27 0935) Pulse Rate:  [53-81] 57 (03/27 0935) Resp:  [8-16] 16 (03/27 0935) BP: (126-156)/(67-92) 150/67 (03/27 0935) SpO2:  [91 %-97 %] 93 % (03/27 0935)  Intake/Output from previous day: 03/26 0701 - 03/27 0700 In: 3661.3 [P.O.:180; I.V.:3181.3; IV Piggyback:300] Out: 1190 [Urine:890; Blood:300] Intake/Output this shift: No intake/output data recorded.  Recent Labs    08/18/19 0338  HGB 13.7   Recent Labs    08/18/19 0338  WBC 14.1*  RBC 4.44  HCT 40.3  PLT 159   Recent Labs    08/18/19 0338  NA 138  K 4.7  CL 104  CO2 24  BUN 21  CREATININE 1.17  GLUCOSE 135*  CALCIUM 8.8*   No results for input(s): LABPT, INR in the last 72 hours.  Sensation intact distally Intact pulses distally Dorsiflexion/Plantar flexion intact Incision: scant drainage   Assessment/Plan: 1 Day Post-Op Procedure(s) (LRB): RIGHT TOTAL HIP ARTHROPLASTY ANTERIOR APPROACH (Right) Up with therapy Discharge home with home health this afternoon.    Patient's anticipated LOS is less than 2 midnights, meeting these requirements: - Younger than 65 - Lives within 1 hour of care - Has a competent adult at home to recover with post-op recover - NO history of  - Chronic pain requiring opiods  - Diabetes  - Coronary Artery Disease  - Heart failure  - Heart attack  - Stroke  - DVT/VTE  - Cardiac arrhythmia  - Respiratory Failure/COPD  - Renal failure  - Anemia  - Advanced Liver disease       Kathryne Hitch 08/18/2019, 10:04 AM

## 2019-08-18 NOTE — Progress Notes (Signed)
Physical Therapy Treatment Patient Details Name: Jonathan Navarro MRN: [id number] DOB: October 27, 1945 Today's Date: 08/18/2019    History of Present Illness Patient is 74 y.o. male s/p Rt THA anterior approach on 08/17/19 with PMH significant for HTN, OA, bil RCR.    PT Comments    Progressing well with mobility. Pain controlled per pt. Pt stated he is planning to go to OP PT once he gets prescription from surgeon. Issued HEP for pt to perform 2x/day until he begins therapy. All education completed. Okay to d/c from PT standpoint.     Follow Up Recommendations  Follow surgeon's recommendation for DC plan and follow-up therapies     Equipment Recommendations  None recommended by PT    Recommendations for Other Services       Precautions / Restrictions Precautions Precautions: Fall Restrictions Weight Bearing Restrictions: No Other Position/Activity Restrictions: WBAT    Mobility  Bed Mobility Overal bed mobility: Needs Assistance Bed Mobility: Supine to Sit     Supine to sit: Min guard;HOB elevated     General bed mobility comments: increased time. no physical assist given  Transfers Overall transfer level: Needs assistance Equipment used: Rolling walker (2 wheeled) Transfers: Sit to/from Stand Sit to Stand: Min guard         General transfer comment: Close guard for safety. VCs safety, hand placement  Ambulation/Gait Ambulation/Gait assistance: Min guard Gait Distance (Feet): 300 Feet Assistive device: Rolling walker (2 wheeled) Gait Pattern/deviations: Step-through pattern;Decreased stride length     General Gait Details: slow gait speed.   Stairs Stairs: (Verbally reviewed technique for threshold step "RW 1st, then up with good/down with bad next")           Wheelchair Mobility    Modified Rankin (Stroke Patients Only)       Balance Overall balance assessment: Needs assistance         Standing balance support: Bilateral upper extremity  supported Standing balance-Leahy Scale: Fair                              Cognition Arousal/Alertness: Awake/alert Behavior During Therapy: WFL for tasks assessed/performed Overall Cognitive Status: Within Functional Limits for tasks assessed                                        Exercises Total Joint Exercises Knee Flexion: AROM;Right;10 reps;Standing(3)    General Comments        Pertinent Vitals/Pain Pain Assessment: 0-10 Pain Score: 6  Pain Location: R hip/thigh Pain Descriptors / Indicators: Sore;Burning;Discomfort Pain Intervention(s): Monitored during session;Repositioned    Home Living                      Prior Function            PT Goals (current goals can now be found in the care plan section) Progress towards PT goals: Progressing toward goals    Frequency    7X/week      PT Plan Current plan remains appropriate    Co-evaluation              AM-PAC PT "6 Clicks" Mobility   Outcome Measure  Help needed turning from your back to your side while in a flat bed without using bedrails?: A Little Help needed moving from lying on your back to sitting  on the side of a flat bed without using bedrails?: A Little Help needed moving to and from a bed to a chair (including a wheelchair)?: A Little Help needed standing up from a chair using your arms (e.g., wheelchair or bedside chair)?: A Little Help needed to walk in hospital room?: A Little Help needed climbing 3-5 steps with a railing? : A Little 6 Click Score: 18    End of Session Equipment Utilized During Treatment: Gait belt Activity Tolerance: Patient tolerated treatment well Patient left: with call bell/phone within reach;with family/visitor present(up in room about to get dressed with wife's assistance)   PT Visit Diagnosis: Muscle weakness (generalized) (M62.81);Difficulty in walking, not elsewhere classified (R26.2);Pain Pain - Right/Left:  Right Pain - part of body: Hip(+ thigh)     Time: 1345-1410 PT Time Calculation (min) (ACUTE ONLY): 25 min  Charges:  $Gait Training: 23-37 mins                         Faye Ramsay, PT Acute Rehabilitation

## 2019-08-18 NOTE — Plan of Care (Signed)
Adequate for discharge.

## 2019-08-18 NOTE — Progress Notes (Signed)
Discharged via wheelchair with wife.  Alert and oriented, skin warm and dry.  No concerns or complaints.

## 2019-08-20 ENCOUNTER — Encounter: Payer: Self-pay | Admitting: *Deleted

## 2019-08-30 ENCOUNTER — Encounter: Payer: Self-pay | Admitting: Orthopaedic Surgery

## 2019-08-30 ENCOUNTER — Ambulatory Visit (INDEPENDENT_AMBULATORY_CARE_PROVIDER_SITE_OTHER): Payer: Medicare Other | Admitting: Orthopaedic Surgery

## 2019-08-30 ENCOUNTER — Other Ambulatory Visit: Payer: Self-pay

## 2019-08-30 DIAGNOSIS — Z96641 Presence of right artificial hip joint: Secondary | ICD-10-CM

## 2019-08-30 MED ORDER — OXYCODONE HCL 5 MG PO TABS: 5.0000 mg | ORAL_TABLET | Freq: Four times a day (QID) | ORAL | 0 refills | Status: DC | PRN

## 2019-08-30 NOTE — Progress Notes (Signed)
The patient comes in today 2 weeks status post a right total hip arthroplasty.  He says that hip is doing very well for him.  He is ambulating with a rolling walker.  He has severe end-stage arthritis of his left hip.  He does feel that he will be having that left hip replaced in the near future.  On examination his right hip incision looks good.  Remove the staples and place Steri-Strips.  There was just a mild seroma drained about 30 cc of fluid off of the hip area.  His leg lengths appear equal.  Overall he looks good.  At this point to go back to his daily aspirin just once a day that he was on before surgery.  I did refill his oxycodone.  I would like to see him back in 4 weeks to see how he is doing overall.  At that point we will work on likely setting him up for a left total hip arthroplasty if he wishes to proceed that route given the severity of the arthritis of his left hip.  All question concerns were answered and addressed.

## 2019-09-27 ENCOUNTER — Ambulatory Visit (INDEPENDENT_AMBULATORY_CARE_PROVIDER_SITE_OTHER): Payer: Medicare Other | Admitting: Orthopaedic Surgery

## 2019-09-27 ENCOUNTER — Other Ambulatory Visit: Payer: Self-pay

## 2019-09-27 ENCOUNTER — Encounter: Payer: Self-pay | Admitting: Orthopaedic Surgery

## 2019-09-27 DIAGNOSIS — M1612 Unilateral primary osteoarthritis, left hip: Secondary | ICD-10-CM

## 2019-09-27 DIAGNOSIS — Z96641 Presence of right artificial hip joint: Secondary | ICD-10-CM

## 2019-09-27 NOTE — Progress Notes (Signed)
The patient is well-known to me.  He has debilitating arthritis involving both his hips.  6 weeks ago he underwent a successful right total hip arthroplasty.  He is only ambulating with a cane now due to the severity of his left hip arthritis and pain.  He feels like he is already gotten over his right hip.  At this point he wishes to proceed with a left total hip arthroplasty.  His x-rays of the left hip show severe end-stage arthritis with bone-on-bone wear.  There is flattening of the femoral head and significant large periarticular osteophytes.  On exam his left hip is very stiff and has pain with any attempts of internal or external rotation of the hip.  His right operative hip is moving smoothly and is pain-free.  At this point we will set him up for a left total hip arthroplasty next month.  Of note, he does take a baby aspirin once a day.  For some reason I had him on Xarelto after his right total hip arthroplasty.  He is allergic to NSAIDs been on aspirin.  He has never had a history of DVTs.  We will treat him with a baby aspirin twice a day for DVT prophylaxis after his left total hip.  We will work on getting this scheduled for hopefully June 4.

## 2019-10-11 ENCOUNTER — Other Ambulatory Visit: Payer: Self-pay | Admitting: Physician Assistant

## 2019-10-17 ENCOUNTER — Encounter (HOSPITAL_COMMUNITY): Payer: Self-pay

## 2019-10-17 NOTE — Patient Instructions (Addendum)
DUE TO COVID-19 ONLY ONE VISITOR ARE ALLOWED TO COME WITH YOU AND STAY IN THE WAITING ROOM ONLY DURING PRE OP AND PROCEDURE. THEN TWO VISITORS MAY VISIT WITH YOU IN YOUR PRIVATE ROOM DURING VISITING HOURS ONLY!!   COVID SWAB TESTING MUST BE COMPLETED ON: Tuesday, October 23, 2019 immediately after lab appointment 1:45PM   [address], [place] -[facility] pre surgical testing line (Must self quarantine after testing. Follow instructions on handout.)             Your procedure is scheduled on: Friday, October 26, 2019   [facility]  Entr[facility]    Report to admitting at 8:30 AM   Call this number if you have problems the morning of surgery [phone]   Do not eat food :After Midnight.   May have liquids until 8:00 AM day of surgery   CLEAR LIQUID DIET  Foods Allowed                                                                     Foods Excluded  Water, Black Coffee and tea, regular and decaf                             liquids that you cannot  Plain Jell-O in any flavor  (No red)                                           see through such as: Fruit ices (not with fruit pulp)                                     milk, soups, orange juice  Iced Popsicles (No red)                                    All solid food Carbonated beverages, regular and diet                                    Apple juices Sports drinks like Gatorade (No red) Lightly seasoned clear broth or consume(fat free) Sugar, honey syrup  Sample Menu Breakfast                                Lunch                                     Supper Cranberry juice                    Beef broth                            Chicken  broth Jell-O                                     Grape juice                           Apple juice Coffee or tea                        Jell-O                                      Popsicle                                                Coffee or tea                         Coffee or tea   Complete one Ensure drink the morning of surgery at 8:00 AM the day of surgery.   Oral Hygiene is also important to reduce your risk of infection.                                    Remember - BRUSH YOUR TEETH THE MORNING OF SURGERY WITH YOUR REGULAR TOOTHPASTE   Do NOT smoke after Midnight   Take these medicines the morning of surgery with A SIP OF WATER: Diltiazem, Esomeprazole, Metoprolol, Xanax if needed                               You may not have any metal on your body including jewelry, and body piercings             Do not wear lotions, powders, perfumes/cologne, or deodorant                          Men may shave face and neck.   Do not bring valuables to the hospital. [place].   Contacts, dentures or bridgework may not be worn into surgery.   Bring small overnight bag day of surgery.    Patients discharged the day of surgery will not be allowed to drive home.   Special Instructions: Bring a copy of your healthcare power of attorney and living will documents         the day of surgery if you haven't scanned them in before.              Please read over the following fact sheets you were given: IF YOU HAVE QUESTIONS ABOUT YOUR PRE OP INSTRUCTIONS PLEASE CALL [phone]   [place] - Preparing for Surgery Before surgery, you can play an important role.  Because skin is not sterile, your skin needs to be as free of germs as possible.  You can reduce the number of germs on your skin by washing with CHG (chlorahexidine gluconate) soap before  surgery.  CHG is an antiseptic cleaner which kills germs and bonds with the skin to continue killing germs even after washing. Please DO NOT use if you have an allergy to CHG or antibacterial soaps.  If your skin becomes reddened/irritated stop using the CHG and inform your nurse when you arrive at Short Stay. Do not shave (including legs and underarms)  for at least 48 hours prior to the first CHG shower.  You may shave your face/neck.  Please follow these instructions carefully:  1.  Shower with CHG Soap the night before surgery and the  morning of surgery.  2.  If you choose to wash your hair, wash your hair first as usual with your normal  shampoo.  3.  After you shampoo, rinse your hair and body thoroughly to remove the shampoo.                             4.  Use CHG as you would any other liquid soap.  You can apply chg directly to the skin and wash.  Gently with a scrungie or clean washcloth.  5.  Apply the CHG Soap to your body ONLY FROM THE NECK DOWN.   Do   not use on face/ open                           Wound or open sores. Avoid contact with eyes, ears mouth and   genitals (private parts).                       Wash face,  Genitals (private parts) with your normal soap.             6.  Wash thoroughly, paying special attention to the area where your    surgery  will be performed.  7.  Thoroughly rinse your body with warm water from the neck down.  8.  DO NOT shower/wash with your normal soap after using and rinsing off the CHG Soap.                9.  Pat yourself dry with a clean towel.            10.  Wear clean pajamas.            11.  Place clean sheets on your bed the night of your first shower and do not  sleep with pets. Day of Surgery : Do not apply any lotions/deodorants the morning of surgery.  Please wear clean clothes to the hospital/surgery center.  FAILURE TO FOLLOW THESE INSTRUCTIONS MAY RESULT IN THE CANCELLATION OF YOUR SURGERY  PATIENT SIGNATURE_________________________________  NURSE SIGNATURE__________________________________  ________________________________________________________________________   Rogelia Mire  An incentive spirometer is a tool that can help keep your lungs clear and active. This tool measures how well you are filling your lungs with each breath. Taking long deep breaths may  help reverse or decrease the chance of developing breathing (pulmonary) problems (especially infection) following:  A long period of time when you are unable to move or be active. BEFORE THE PROCEDURE   If the spirometer includes an indicator to show your best effort, your nurse or respiratory therapist will set it to a desired goal.  If possible, sit up straight or lean slightly forward. Try not to slouch.  Hold the incentive spirometer in an upright position. INSTRUCTIONS FOR USE  1. Sit on the edge of your bed if possible, or sit up as far as you can in bed or on a chair. 2. Hold the incentive spirometer in an upright position. 3. Breathe out normally. 4. Place the mouthpiece in your mouth and seal your lips tightly around it. 5. Breathe in slowly and as deeply as possible, raising the piston or the ball toward the top of the column. 6. Hold your breath for 3-5 seconds or for as long as possible. Allow the piston or ball to fall to the bottom of the column. 7. Remove the mouthpiece from your mouth and breathe out normally. 8. Rest for a few seconds and repeat Steps 1 through 7 at least 10 times every 1-2 hours when you are awake. Take your time and take a few normal breaths between deep breaths. 9. The spirometer may include an indicator to show your best effort. Use the indicator as a goal to work toward during each repetition. 10. After each set of 10 deep breaths, practice coughing to be sure your lungs are clear. If you have an incision (the cut made at the time of surgery), support your incision when coughing by placing a pillow or rolled up towels firmly against it. Once you are able to get out of bed, walk around indoors and cough well. You may stop using the incentive spirometer when instructed by your caregiver.  RISKS AND COMPLICATIONS  Take your time so you do not get dizzy or light-headed.  If you are in pain, you may need to take or ask for pain medication before doing  incentive spirometry. It is harder to take a deep breath if you are having pain. AFTER USE  Rest and breathe slowly and easily.  It can be helpful to keep track of a log of your progress. Your caregiver can provide you with a simple table to help with this. If you are using the spirometer at home, follow these instructions: SEEK MEDICAL CARE IF:   You are having difficultly using the spirometer.  You have trouble using the spirometer as often as instructed.  Your pain medication is not giving enough relief while using the spirometer.  You develop fever of 100.5 F (38.1 C) or higher. SEEK IMMEDIATE MEDICAL CARE IF:   You cough up bloody sputum that had not been present before.  You develop fever of 102 F (38.9 C) or greater.  You develop worsening pain at or near the incision site. MAKE SURE YOU:   Understand these instructions.  Will watch your condition.  Will get help right away if you are not doing well or get worse. Document Released: 09/20/2006 Document Revised: 08/02/2011 Document Reviewed: 11/21/2006 ExitCare Patient Information 2014 ExitCare, [place].   ________________________________________________________________________  WHAT IS A BLOOD TRANSFUSION? Blood Transfusion Information  A transfusion is the replacement of blood or some of its parts. Blood is made up of multiple cells which provide different functions.  Red blood cells carry oxygen and are used for blood loss replacement.  White blood cells fight against infection.  Platelets control bleeding.  Plasma helps clot blood.  Other blood products are available for specialized needs, such as hemophilia or other clotting disorders. BEFORE THE TRANSFUSION  Who gives blood for transfusions?   Healthy volunteers who are fully evaluated to make sure their blood is safe. This is blood bank blood. Transfusion therapy is the safest it has ever been in the practice of medicine. Before blood is taken from a  donor, a complete history is taken to make sure that person has no history of diseases nor engages in risky social behavior (examples are intravenous drug use or sexual activity with multiple partners). The donor's travel history is screened to minimize risk of transmitting infections, such as malaria. The donated blood is tested for signs of infectious diseases, such as HIV and hepatitis. The blood is then tested to be sure it is compatible with you in order to minimize the chance of a transfusion reaction. If you or a relative donates blood, this is often done in anticipation of surgery and is not appropriate for emergency situations. It takes many days to process the donated blood. RISKS AND COMPLICATIONS Although transfusion therapy is very safe and saves many lives, the main dangers of transfusion include:   Getting an infectious disease.  Developing a transfusion reaction. This is an allergic reaction to something in the blood you were given. Every precaution is taken to prevent this. The decision to have a blood transfusion has been considered carefully by your caregiver before blood is given. Blood is not given unless the benefits outweigh the risks. AFTER THE TRANSFUSION  Right after receiving a blood transfusion, you will usually feel much better and more energetic. This is especially true if your red blood cells have gotten low (anemic). The transfusion raises the level of the red blood cells which carry oxygen, and this usually causes an energy increase.  The nurse administering the transfusion will monitor you carefully for complications. HOME CARE INSTRUCTIONS  No special instructions are needed after a transfusion. You may find your energy is better. Speak with your caregiver about any limitations on activity for underlying diseases you may have. SEEK MEDICAL CARE IF:   Your condition is not improving after your transfusion.  You develop redness or irritation at the intravenous (IV)  site. SEEK IMMEDIATE MEDICAL CARE IF:  Any of the following symptoms occur over the next 12 hours:  Shaking chills.  You have a temperature by mouth above 102 F (38.9 C), not controlled by medicine.  Chest, back, or muscle pain.  People around you feel you are not acting correctly or are confused.  Shortness of breath or difficulty breathing.  Dizziness and fainting.  You get a rash or develop hives.  You have a decrease in urine output.  Your urine turns a dark color or changes to pink, red, or brown. Any of the following symptoms occur over the next 10 days:  You have a temperature by mouth above 102 F (38.9 C), not controlled by medicine.  Shortness of breath.  Weakness after normal activity.  The white part of the eye turns yellow (jaundice).  You have a decrease in the amount of urine or are urinating less often.  Your urine turns a dark color or changes to pink, red, or brown. Document Released: 05/07/2000 Document Revised: 08/02/2011 Document Reviewed: 12/25/2007 [facility] Patient Information 2014 [place], [place].  _______________________________________________________________________

## 2019-10-18 ENCOUNTER — Encounter (HOSPITAL_COMMUNITY)
Admission: RE | Admit: 2019-10-18 | Discharge: 2019-10-18 | Disposition: A | Discharge status: Home / Self Care | Payer: Medicare Other | Source: Ambulatory Visit | Attending: Orthopaedic Surgery | Admitting: Orthopaedic Surgery

## 2019-10-18 ENCOUNTER — Other Ambulatory Visit: Payer: Self-pay

## 2019-10-18 ENCOUNTER — Encounter (HOSPITAL_COMMUNITY): Payer: Self-pay

## 2019-10-18 HISTORY — DX: Gastro-esophageal reflux disease without esophagitis: K21.9

## 2019-10-18 HISTORY — DX: Supraventricular tachycardia, unspecified: I47.10

## 2019-10-18 HISTORY — DX: Anxiety disorder, unspecified: F41.9

## 2019-10-18 HISTORY — DX: Supraventricular tachycardia: I47.1

## 2019-10-18 NOTE — Progress Notes (Signed)
COVID Vaccine Completed: Yes Date COVID Vaccine completed: End of March or beginning of April 2021 COVID vaccine manufacturer: Moderna     PCP - V. Hairston N.P. Cardiologist - Dr. Verlin Grills [facility]  Chest x-ray - N/A EKG - 08/09/19 Stress Test - 07/2019 ECHO - 07/2019 Cardiac Cath - greater than 2 year  Sleep Study -Yes  CPAP - No sleep apnea  Fasting Blood Sugar - N/A Checks Blood Sugar __N/A___ times a day  Blood Thinner Instructions: N/A Aspirin Instructions: Yes Last Dose: Will stop 3 days prior to surgery per patient  Anesthesia review: N/A  Patient denies shortness of breath, fever, cough and chest pain at PAT appointment   Patient verbalized understanding of instructions that were given to them at the PAT appointment. Patient was also instructed that they will need to review over the PAT instructions again at home before surgery.

## 2019-10-23 ENCOUNTER — Other Ambulatory Visit: Payer: Self-pay

## 2019-10-23 ENCOUNTER — Encounter (HOSPITAL_COMMUNITY)
Admission: RE | Admit: 2019-10-23 | Discharge: 2019-10-23 | Disposition: A | Discharge status: Home / Self Care | Payer: Medicare Other | Source: Ambulatory Visit | Attending: Orthopaedic Surgery | Admitting: Orthopaedic Surgery

## 2019-10-23 ENCOUNTER — Other Ambulatory Visit (HOSPITAL_COMMUNITY)
Admission: RE | Admit: 2019-10-23 | Discharge: 2019-10-23 | Disposition: A | Discharge status: Home / Self Care | Payer: Medicare Other | Source: Ambulatory Visit | Attending: Orthopaedic Surgery | Admitting: Orthopaedic Surgery

## 2019-10-23 DIAGNOSIS — Z01812 Encounter for preprocedural laboratory examination: Secondary | ICD-10-CM | POA: Diagnosis not present

## 2019-10-23 DIAGNOSIS — Z20822 Contact with and (suspected) exposure to covid-19: Secondary | ICD-10-CM | POA: Insufficient documentation

## 2019-10-23 LAB — SURGICAL PCR SCREEN
MRSA, PCR: NEGATIVE
Staphylococcus aureus: NEGATIVE

## 2019-10-23 LAB — CBC
HCT: 40.5 % (ref 39.0–52.0)
Hemoglobin: 13.6 g/dL (ref 13.0–17.0)
MCH: 30.6 pg (ref 26.0–34.0)
MCHC: 33.6 g/dL (ref 30.0–36.0)
MCV: 91 fL (ref 80.0–100.0)
Platelets: 204 10*3/uL (ref 150–400)
RBC: 4.45 MIL/uL (ref 4.22–5.81)
RDW: 12.8 % (ref 11.5–15.5)
WBC: 8.7 10*3/uL (ref 4.0–10.5)
nRBC: 0 % (ref 0.0–0.2)

## 2019-10-23 LAB — BASIC METABOLIC PANEL
Anion gap: 9 (ref 5–15)
BUN: 20 mg/dL (ref 8–23)
CO2: 26 mmol/L (ref 22–32)
Calcium: 8.9 mg/dL (ref 8.9–10.3)
Chloride: 106 mmol/L (ref 98–111)
Creatinine, Ser: 1.19 mg/dL (ref 0.61–1.24)
GFR calc Af Amer: >60 mL/min (ref >60)
GFR calc non Af Amer: >60 mL/min (ref >60)
Glucose, Bld: 97 mg/dL (ref 70–99)
Potassium: 4 mmol/L (ref 3.5–5.1)
Sodium: 141 mmol/L (ref 135–145)

## 2019-10-23 LAB — ABO/RH: ABO/RH(D): A POS

## 2019-10-24 LAB — SARS CORONAVIRUS 2 (TAT 6-24 HRS): SARS Coronavirus 2: NEGATIVE

## 2019-10-25 NOTE — H&P (Signed)
TOTAL HIP ADMISSION H&P  Patient is admitted for left total hip arthroplasty.  Subjective:  Chief Complaint: left hip pain  HPI: Jonathan Navarro, 74 y.o. male, has a history of pain and functional disability in the left hip(s) due to arthritis and patient has failed non-surgical conservative treatments for greater than 12 weeks to include NSAID's and/or analgesics, corticosteriod injections, flexibility and strengthening excercises, supervised PT with diminished ADL's post treatment, use of assistive devices and activity modification.  Onset of symptoms was gradual starting 2 years ago with gradually worsening course since that time.The patient noted no past surgery on the left hip(s).  Patient currently rates pain in the left hip at 10 out of 10 with activity. Patient has night pain, worsening of pain with activity and weight bearing, pain that interfers with activities of daily living and pain with passive range of motion. Patient has evidence of subchondral sclerosis, periarticular osteophytes and joint space narrowing by imaging studies. This condition presents safety issues increasing the risk of falls.  There is no current active infection.  Patient Active Problem List   Diagnosis Date Noted  . Status post total replacement of right hip 08/17/2019  . Unilateral primary osteoarthritis, left hip 07/25/2019  . Unilateral primary osteoarthritis, right hip 07/25/2019   Past Medical History:  Diagnosis Date  . Anxiety   . Arthritis   . GERD (gastroesophageal reflux disease)   . Hypertension   . SVT (supraventricular tachycardia) (HCC)     Past Surgical History:  Procedure Laterality Date  . ABDOMINAL EXPLORATION SURGERY    . ABDOMINAL HERNIA REPAIR    . APPENDECTOMY    . CHOLECYSTECTOMY    . COLONOSCOPY    . NISSEN FUNDOPLICATION    . PILONIDAL CYST EXCISION    . ROTATOR CUFF REPAIR Right    x2  . ROTATOR CUFF REPAIR Left   . TOTAL HIP ARTHROPLASTY Right 08/17/2019   Procedure: RIGHT  TOTAL HIP ARTHROPLASTY ANTERIOR APPROACH;  Surgeon: Kathryne Hitch, MD;  Location: [facility];  Service: Orthopedics;  Laterality: Right;  . UPPER GI ENDOSCOPY      No current facility-administered medications for this encounter.   Current Outpatient Medications  Medication Sig Dispense Refill Last Dose  . ALPRAZolam (XANAX) 1 MG tablet Take 1 mg by mouth 2 (two) times daily as needed for anxiety.     Marland Kitchen aspirin EC 81 MG tablet Take 81 mg by mouth daily.     . Cholecalciferol 50 MCG (2000 UT) CAPS Take 2,000 Units by mouth daily.     . cholestyramine (QUESTRAN) 4 GM/DOSE powder Take 4 g by mouth daily as needed.     . diltiazem (TIAZAC) 360 MG 24 hr capsule Take 360 mg by mouth daily.     Marland Kitchen esomeprazole (NEXIUM) 40 MG capsule Take 40 mg by mouth daily before breakfast.     . folic acid (FOLVITE) 400 MCG tablet Take 400 mcg by mouth daily.     . metoprolol tartrate (LOPRESSOR) 50 MG tablet Take 50 mg by mouth 2 (two) times daily.     . Multiple Vitamin (MULTIVITAMIN ADULT PO) Take 1 tablet by mouth daily.     Marland Kitchen OVER THE COUNTER MEDICATION Take 1 capsule by mouth daily. pyrroloquinoline quinone (PQQ)     . oxyCODONE (OXY IR/ROXICODONE) 5 MG immediate release tablet Take 1-2 tablets (5-10 mg total) by mouth every 6 (six) hours as needed for moderate pain (pain score 4-6). 30 tablet 0   . sucralfate (  CARAFATE) 1 g tablet Take 1 g by mouth 3 (three) times daily as needed (stomach acid).      . methocarbamol (ROBAXIN) 500 MG tablet Take 1 tablet (500 mg total) by mouth every 6 (six) hours as needed for muscle spasms. (Patient not taking: Reported on 10/15/2019) 60 tablet 0 Not Taking at Unknown time  . rivaroxaban (XARELTO) 10 MG TABS tablet Take 1 tablet (10 mg total) by mouth daily with breakfast. (Patient not taking: Reported on 10/15/2019) 20 tablet 0 Not Taking at Unknown time   Allergies  Allergen Reactions  . Caffeine     Heart racing  . Codeine Nausea Only  . Nsaids     Severe  irregular heartbeat    Social History   Tobacco Use  . Smoking status: Former Research scientist (life sciences)  . Smokeless tobacco: Never Used  Substance Use Topics  . Alcohol use: Yes    Comment: occ.    No family history on file.   Review of Systems  All other systems reviewed and are negative.   Objective:  Physical Exam  Constitutional: He is oriented to person, place, and time. He appears well-developed and well-nourished.  HENT:  Head: Normocephalic and atraumatic.  Eyes: Pupils are equal, round, and reactive to light. EOM are normal.  Cardiovascular: Normal rate.  Respiratory: Effort normal.  GI: Soft.  Musculoskeletal:     Cervical back: Normal range of motion and neck supple.     Left hip: Tenderness and bony tenderness present. Decreased range of motion. Decreased strength.  Neurological: He is alert and oriented to person, place, and time.  Skin: Skin is warm and dry.  Psychiatric: He has a normal mood and affect.    Vital signs in last 24 hours:    Labs:   Estimated body mass index is 31.21 kg/m as calculated from the following:   Height as of 10/23/19: 6' (1.829 m).   Weight as of 10/23/19: 104.4 kg.   Imaging Review Plain radiographs demonstrate severe degenerative joint disease of the left hip(s). The bone quality appears to be excellent for age and reported activity level.      Assessment/Plan:  End stage arthritis, left hip(s)  The patient history, physical examination, clinical judgement of the provider and imaging studies are consistent with end stage degenerative joint disease of the left hip(s) and total hip arthroplasty is deemed medically necessary. The treatment options including medical management, injection therapy, arthroscopy and arthroplasty were discussed at length. The risks and benefits of total hip arthroplasty were presented and reviewed. The risks due to aseptic loosening, infection, stiffness, dislocation/subluxation,  thromboembolic complications and  other imponderables were discussed.  The patient acknowledged the explanation, agreed to proceed with the plan and consent was signed. Patient is being admitted for inpatient treatment for surgery, pain control, PT, OT, prophylactic antibiotics, VTE prophylaxis, progressive ambulation and ADL's and discharge planning.The patient is planning to be discharged home with home health services

## 2019-10-26 ENCOUNTER — Ambulatory Visit (HOSPITAL_COMMUNITY): Payer: Medicare Other

## 2019-10-26 ENCOUNTER — Encounter (HOSPITAL_COMMUNITY)
Admission: RE | Disposition: A | Discharge status: Home / Self Care | Payer: Self-pay | Source: Home / Self Care | Attending: Orthopaedic Surgery

## 2019-10-26 ENCOUNTER — Ambulatory Visit (HOSPITAL_COMMUNITY): Payer: Medicare Other | Admitting: Anesthesiology

## 2019-10-26 ENCOUNTER — Other Ambulatory Visit: Payer: Self-pay

## 2019-10-26 ENCOUNTER — Observation Stay (HOSPITAL_COMMUNITY)
Admission: RE | Admit: 2019-10-26 | Discharge: 2019-10-27 | Disposition: A | Discharge status: Home / Self Care | Payer: Medicare Other | Attending: Orthopaedic Surgery | Admitting: Orthopaedic Surgery

## 2019-10-26 ENCOUNTER — Observation Stay (HOSPITAL_COMMUNITY): Payer: Medicare Other

## 2019-10-26 ENCOUNTER — Encounter (HOSPITAL_COMMUNITY): Payer: Self-pay | Admitting: Orthopaedic Surgery

## 2019-10-26 DIAGNOSIS — E785 Hyperlipidemia, unspecified: Secondary | ICD-10-CM | POA: Diagnosis not present

## 2019-10-26 DIAGNOSIS — M1612 Unilateral primary osteoarthritis, left hip: Secondary | ICD-10-CM | POA: Diagnosis not present

## 2019-10-26 DIAGNOSIS — R7981 Abnormal blood-gas level: Secondary | ICD-10-CM

## 2019-10-26 DIAGNOSIS — I10 Essential (primary) hypertension: Secondary | ICD-10-CM | POA: Insufficient documentation

## 2019-10-26 DIAGNOSIS — F419 Anxiety disorder, unspecified: Secondary | ICD-10-CM | POA: Diagnosis not present

## 2019-10-26 DIAGNOSIS — K219 Gastro-esophageal reflux disease without esophagitis: Secondary | ICD-10-CM | POA: Diagnosis not present

## 2019-10-26 DIAGNOSIS — E669 Obesity, unspecified: Secondary | ICD-10-CM | POA: Insufficient documentation

## 2019-10-26 DIAGNOSIS — Z419 Encounter for procedure for purposes other than remedying health state, unspecified: Secondary | ICD-10-CM

## 2019-10-26 DIAGNOSIS — Z87891 Personal history of nicotine dependence: Secondary | ICD-10-CM | POA: Insufficient documentation

## 2019-10-26 DIAGNOSIS — Z6831 Body mass index (BMI) 31.0-31.9, adult: Secondary | ICD-10-CM | POA: Insufficient documentation

## 2019-10-26 DIAGNOSIS — Z7982 Long term (current) use of aspirin: Secondary | ICD-10-CM | POA: Diagnosis not present

## 2019-10-26 DIAGNOSIS — Z79899 Other long term (current) drug therapy: Secondary | ICD-10-CM | POA: Insufficient documentation

## 2019-10-26 DIAGNOSIS — Z96641 Presence of right artificial hip joint: Secondary | ICD-10-CM | POA: Diagnosis not present

## 2019-10-26 DIAGNOSIS — Z7901 Long term (current) use of anticoagulants: Secondary | ICD-10-CM | POA: Insufficient documentation

## 2019-10-26 DIAGNOSIS — Z96642 Presence of left artificial hip joint: Secondary | ICD-10-CM

## 2019-10-26 HISTORY — PX: TOTAL HIP ARTHROPLASTY: SHX124

## 2019-10-26 LAB — TYPE AND SCREEN
ABO/RH(D): A POS
Antibody Screen: NEGATIVE

## 2019-10-26 SURGERY — ARTHROPLASTY, HIP, TOTAL, ANTERIOR APPROACH
Anesthesia: Spinal | Site: Hip | Laterality: Left

## 2019-10-26 MED ORDER — ONDANSETRON HCL 4 MG/2ML IJ SOLN: 4.0000 mg | Freq: Four times a day (QID) | INTRAMUSCULAR | Status: DC | PRN

## 2019-10-26 MED ORDER — FENTANYL CITRATE (PF) 100 MCG/2ML IJ SOLN
INTRAMUSCULAR | Status: DC | PRN
  Administered 2019-10-26: 100 ug via INTRAVENOUS

## 2019-10-26 MED ORDER — PHENOL 1.4 % MT LIQD: 1.0000 | OROMUCOSAL | Status: DC | PRN

## 2019-10-26 MED ORDER — CEFAZOLIN SODIUM-DEXTROSE 2-4 GM/100ML-% IV SOLN
2.0000 g | INTRAVENOUS | Status: AC
  Administered 2019-10-26: 2 g via INTRAVENOUS
  Filled 2019-10-26: qty 100

## 2019-10-26 MED ORDER — MIDAZOLAM HCL 5 MG/5ML IJ SOLN
INTRAMUSCULAR | Status: DC | PRN
  Administered 2019-10-26: 2 mg via INTRAVENOUS

## 2019-10-26 MED ORDER — DIPHENHYDRAMINE HCL 12.5 MG/5ML PO ELIX: 12.5000 mg | ORAL_SOLUTION | ORAL | Status: DC | PRN

## 2019-10-26 MED ORDER — BUPIVACAINE IN DEXTROSE 0.75-8.25 % IT SOLN
INTRATHECAL | Status: DC | PRN
  Administered 2019-10-26: 2 mL via INTRATHECAL

## 2019-10-26 MED ORDER — ASPIRIN 81 MG PO CHEW
81.0000 mg | CHEWABLE_TABLET | Freq: Two times a day (BID) | ORAL | Status: DC
  Administered 2019-10-26 – 2019-10-27 (×2): 81 mg via ORAL
  Filled 2019-10-26 (×3): qty 1

## 2019-10-26 MED ORDER — POVIDONE-IODINE 10 % EX SWAB: 2.0000 "application " | Freq: Once | CUTANEOUS | Status: DC

## 2019-10-26 MED ORDER — OXYCODONE HCL 5 MG PO TABS
10.0000 mg | ORAL_TABLET | ORAL | Status: DC | PRN
  Administered 2019-10-26 – 2019-10-27 (×3): 10 mg via ORAL
  Filled 2019-10-26 (×3): qty 2

## 2019-10-26 MED ORDER — PROPOFOL 10 MG/ML IV BOLUS: INTRAVENOUS | Status: DC | PRN

## 2019-10-26 MED ORDER — FENTANYL CITRATE (PF) 100 MCG/2ML IJ SOLN
INTRAMUSCULAR | Status: AC
  Filled 2019-10-26: qty 2

## 2019-10-26 MED ORDER — PROPOFOL 500 MG/50ML IV EMUL
INTRAVENOUS | Status: DC | PRN
  Administered 2019-10-26: 75 ug/kg/min via INTRAVENOUS

## 2019-10-26 MED ORDER — PANTOPRAZOLE SODIUM 40 MG PO TBEC
40.0000 mg | DELAYED_RELEASE_TABLET | Freq: Every day | ORAL | Status: DC
  Administered 2019-10-27: 40 mg via ORAL
  Filled 2019-10-26: qty 1

## 2019-10-26 MED ORDER — OXYCODONE HCL 5 MG PO TABS: 5.0000 mg | ORAL_TABLET | Freq: Once | ORAL | Status: DC | PRN

## 2019-10-26 MED ORDER — ONDANSETRON HCL 4 MG/2ML IJ SOLN
INTRAMUSCULAR | Status: DC | PRN
  Administered 2019-10-26: 4 mg via INTRAVENOUS

## 2019-10-26 MED ORDER — PROPOFOL 10 MG/ML IV BOLUS
INTRAVENOUS | Status: DC | PRN
  Administered 2019-10-26: 20 mg via INTRAVENOUS
  Administered 2019-10-26: 40 mg via INTRAVENOUS

## 2019-10-26 MED ORDER — ALUM & MAG HYDROXIDE-SIMETH 200-200-20 MG/5ML PO SUSP: 30.0000 mL | ORAL | Status: DC | PRN

## 2019-10-26 MED ORDER — ONDANSETRON HCL 4 MG PO TABS: 4.0000 mg | ORAL_TABLET | Freq: Four times a day (QID) | ORAL | Status: DC | PRN

## 2019-10-26 MED ORDER — LACTATED RINGERS IV SOLN: INTRAVENOUS | Status: DC

## 2019-10-26 MED ORDER — METOPROLOL TARTRATE 50 MG PO TABS
50.0000 mg | ORAL_TABLET | Freq: Two times a day (BID) | ORAL | Status: DC
  Administered 2019-10-27: 50 mg via ORAL
  Filled 2019-10-26 (×3): qty 1

## 2019-10-26 MED ORDER — VITAMIN D 25 MCG (1000 UNIT) PO TABS
2000.0000 [IU] | ORAL_TABLET | Freq: Every day | ORAL | Status: DC
  Administered 2019-10-27: 2000 [IU] via ORAL
  Filled 2019-10-26 (×2): qty 2

## 2019-10-26 MED ORDER — METHOCARBAMOL 500 MG IVPB - SIMPLE MED
500.0000 mg | Freq: Four times a day (QID) | INTRAVENOUS | Status: DC | PRN
  Administered 2019-10-26: 500 mg via INTRAVENOUS
  Filled 2019-10-26: qty 50

## 2019-10-26 MED ORDER — METHOCARBAMOL 500 MG PO TABS
500.0000 mg | ORAL_TABLET | Freq: Four times a day (QID) | ORAL | Status: DC | PRN
  Administered 2019-10-26 – 2019-10-27 (×2): 500 mg via ORAL
  Filled 2019-10-26 (×3): qty 1

## 2019-10-26 MED ORDER — DOCUSATE SODIUM 100 MG PO CAPS
100.0000 mg | ORAL_CAPSULE | Freq: Two times a day (BID) | ORAL | Status: DC
  Administered 2019-10-26 – 2019-10-27 (×2): 100 mg via ORAL
  Filled 2019-10-26 (×2): qty 1

## 2019-10-26 MED ORDER — STERILE WATER FOR IRRIGATION IR SOLN
Status: DC | PRN
  Administered 2019-10-26: 2000 mL

## 2019-10-26 MED ORDER — DILTIAZEM HCL ER BEADS 240 MG PO CP24
360.0000 mg | ORAL_CAPSULE | Freq: Every day | ORAL | Status: DC
  Filled 2019-10-26: qty 1

## 2019-10-26 MED ORDER — HYDROMORPHONE HCL 1 MG/ML IJ SOLN
INTRAMUSCULAR | Status: AC
  Filled 2019-10-26: qty 1

## 2019-10-26 MED ORDER — METOCLOPRAMIDE HCL 5 MG/ML IJ SOLN: 5.0000 mg | Freq: Three times a day (TID) | INTRAMUSCULAR | Status: DC | PRN

## 2019-10-26 MED ORDER — ACETAMINOPHEN 325 MG PO TABS: 325.0000 mg | ORAL_TABLET | Freq: Four times a day (QID) | ORAL | Status: DC | PRN

## 2019-10-26 MED ORDER — GABAPENTIN 100 MG PO CAPS
100.0000 mg | ORAL_CAPSULE | Freq: Three times a day (TID) | ORAL | Status: DC
  Administered 2019-10-26 – 2019-10-27 (×3): 100 mg via ORAL
  Filled 2019-10-26 (×3): qty 1

## 2019-10-26 MED ORDER — POLYETHYLENE GLYCOL 3350 17 G PO PACK: 17.0000 g | PACK | Freq: Every day | ORAL | Status: DC | PRN

## 2019-10-26 MED ORDER — HYDROMORPHONE HCL 1 MG/ML IJ SOLN: 1.0000 mg | INTRAMUSCULAR | Status: DC | PRN

## 2019-10-26 MED ORDER — OXYCODONE HCL 5 MG PO TABS
5.0000 mg | ORAL_TABLET | ORAL | Status: DC | PRN
  Administered 2019-10-26: 10 mg via ORAL
  Administered 2019-10-27: 5 mg via ORAL
  Filled 2019-10-26: qty 2
  Filled 2019-10-26: qty 1
  Filled 2019-10-26: qty 2

## 2019-10-26 MED ORDER — ALPRAZOLAM 1 MG PO TABS: 1.0000 mg | ORAL_TABLET | Freq: Two times a day (BID) | ORAL | Status: DC | PRN

## 2019-10-26 MED ORDER — HYDROMORPHONE HCL 1 MG/ML IJ SOLN
INTRAMUSCULAR | Status: AC
  Administered 2019-10-26: 0.5 mg via INTRAVENOUS
  Filled 2019-10-26: qty 1

## 2019-10-26 MED ORDER — 0.9 % SODIUM CHLORIDE (POUR BTL) OPTIME
TOPICAL | Status: DC | PRN
  Administered 2019-10-26: 1000 mL

## 2019-10-26 MED ORDER — DEXAMETHASONE SODIUM PHOSPHATE 10 MG/ML IJ SOLN
INTRAMUSCULAR | Status: DC | PRN
  Administered 2019-10-26: 10 mg via INTRAVENOUS

## 2019-10-26 MED ORDER — TRANEXAMIC ACID-NACL 1000-0.7 MG/100ML-% IV SOLN
1000.0000 mg | INTRAVENOUS | Status: AC
  Administered 2019-10-26: 1000 mg via INTRAVENOUS
  Filled 2019-10-26: qty 100

## 2019-10-26 MED ORDER — PROPOFOL 1000 MG/100ML IV EMUL
INTRAVENOUS | Status: AC
  Filled 2019-10-26: qty 100

## 2019-10-26 MED ORDER — SODIUM CHLORIDE 0.9 % IR SOLN
Status: DC | PRN
  Administered 2019-10-26: 1000 mL

## 2019-10-26 MED ORDER — HYDROMORPHONE HCL 1 MG/ML IJ SOLN
0.2500 mg | INTRAMUSCULAR | Status: DC | PRN
  Administered 2019-10-26: 0.5 mg via INTRAVENOUS
  Administered 2019-10-26 (×2): 0.25 mg via INTRAVENOUS
  Administered 2019-10-26 (×2): 0.5 mg via INTRAVENOUS

## 2019-10-26 MED ORDER — HYDROMORPHONE HCL 1 MG/ML IJ SOLN
0.5000 mg | INTRAMUSCULAR | Status: DC | PRN
  Administered 2019-10-27 (×2): 1 mg via INTRAVENOUS
  Filled 2019-10-26 (×2): qty 1

## 2019-10-26 MED ORDER — SODIUM CHLORIDE 0.9 % IV SOLN: INTRAVENOUS | Status: DC

## 2019-10-26 MED ORDER — FOLIC ACID 0.5 MG HALF TAB
500.0000 ug | ORAL_TABLET | Freq: Every day | ORAL | Status: DC
  Filled 2019-10-26 (×2): qty 1

## 2019-10-26 MED ORDER — MENTHOL 3 MG MT LOZG: 1.0000 | LOZENGE | OROMUCOSAL | Status: DC | PRN

## 2019-10-26 MED ORDER — CHLORHEXIDINE GLUCONATE 0.12 % MT SOLN
15.0000 mL | Freq: Once | OROMUCOSAL | Status: AC
  Administered 2019-10-26: 15 mL via OROMUCOSAL

## 2019-10-26 MED ORDER — METOCLOPRAMIDE HCL 5 MG PO TABS: 5.0000 mg | ORAL_TABLET | Freq: Three times a day (TID) | ORAL | Status: DC | PRN

## 2019-10-26 MED ORDER — OXYCODONE HCL 5 MG/5ML PO SOLN: 5.0000 mg | Freq: Once | ORAL | Status: DC | PRN

## 2019-10-26 MED ORDER — DEXAMETHASONE SODIUM PHOSPHATE 10 MG/ML IJ SOLN
INTRAMUSCULAR | Status: AC
  Filled 2019-10-26: qty 1

## 2019-10-26 MED ORDER — MIDAZOLAM HCL 2 MG/2ML IJ SOLN
INTRAMUSCULAR | Status: AC
  Filled 2019-10-26: qty 2

## 2019-10-26 MED ORDER — ORAL CARE MOUTH RINSE: 15.0000 mL | Freq: Once | OROMUCOSAL | Status: AC

## 2019-10-26 MED ORDER — METHOCARBAMOL 500 MG IVPB - SIMPLE MED
INTRAVENOUS | Status: AC
  Filled 2019-10-26: qty 50

## 2019-10-26 MED ORDER — PROPOFOL 10 MG/ML IV BOLUS
INTRAVENOUS | Status: AC
  Filled 2019-10-26: qty 20

## 2019-10-26 MED ORDER — SUCRALFATE 1 G PO TABS: 1.0000 g | ORAL_TABLET | Freq: Three times a day (TID) | ORAL | Status: DC | PRN

## 2019-10-26 MED ORDER — ONDANSETRON HCL 4 MG/2ML IJ SOLN
INTRAMUSCULAR | Status: AC
  Filled 2019-10-26: qty 2

## 2019-10-26 SURGICAL SUPPLY — 42 items
BAG ZIPLOCK 12X15 (MISCELLANEOUS) IMPLANT
BALL HIP ARTICU EZE 36 8.5 (Hips) ×1 IMPLANT
BENZOIN TINCTURE PRP APPL 2/3 (GAUZE/BANDAGES/DRESSINGS) IMPLANT
BLADE SAW SGTL 18X1.27X75 (BLADE) ×2 IMPLANT
BLADE SAW SGTL 18X1.27X75MM (BLADE) ×1
CLOSURE WOUND 1/2 X4 (GAUZE/BANDAGES/DRESSINGS)
COVER PERINEAL POST (MISCELLANEOUS) ×3 IMPLANT
COVER SURGICAL LIGHT HANDLE (MISCELLANEOUS) ×3 IMPLANT
COVER WAND RF STERILE (DRAPES) ×3 IMPLANT
CUP ACET PNNCL SECTR W/GRIP 56 (Hips) ×1 IMPLANT
DRAPE STERI IOBAN 125X83 (DRAPES) ×3 IMPLANT
DRAPE U-SHAPE 47X51 STRL (DRAPES) ×6 IMPLANT
DRSG AQUACEL AG ADV 3.5X10 (GAUZE/BANDAGES/DRESSINGS) ×3 IMPLANT
DURAPREP 26ML APPLICATOR (WOUND CARE) ×3 IMPLANT
ELECT REM PT RETURN 15FT ADLT (MISCELLANEOUS) ×3 IMPLANT
GAUZE XEROFORM 1X8 LF (GAUZE/BANDAGES/DRESSINGS) ×3 IMPLANT
GLOVE BIO SURGEON STRL SZ7.5 (GLOVE) ×3 IMPLANT
GLOVE BIOGEL PI IND STRL 8 (GLOVE) ×2 IMPLANT
GLOVE BIOGEL PI INDICATOR 8 (GLOVE) ×4
GLOVE ECLIPSE 8.0 STRL XLNG CF (GLOVE) ×3 IMPLANT
GOWN STRL REUS W/TWL XL LVL3 (GOWN DISPOSABLE) ×6 IMPLANT
HANDPIECE INTERPULSE COAX TIP (DISPOSABLE) ×2
HIP BALL ARTICU EZE 36 8.5 (Hips) ×3 IMPLANT
HOLDER FOLEY CATH W/STRAP (MISCELLANEOUS) ×3 IMPLANT
KIT TURNOVER KIT A (KITS) IMPLANT
PACK ANTERIOR HIP CUSTOM (KITS) ×3 IMPLANT
PENCIL SMOKE EVACUATOR (MISCELLANEOUS) ×3 IMPLANT
PINN SECTOR W/GRIP ACE CUP 56 (Hips) ×3 IMPLANT
PINNACLE ALTRX PLUS 4 N 36X56 (Hips) ×3 IMPLANT
SET HNDPC FAN SPRY TIP SCT (DISPOSABLE) ×1 IMPLANT
STAPLER VISISTAT 35W (STAPLE) ×3 IMPLANT
STEM CORAIL KA14 (Stem) ×3 IMPLANT
STRIP CLOSURE SKIN 1/2X4 (GAUZE/BANDAGES/DRESSINGS) IMPLANT
SUT ETHIBOND NAB CT1 #1 30IN (SUTURE) ×3 IMPLANT
SUT ETHILON 2 0 PS N (SUTURE) IMPLANT
SUT MNCRL AB 4-0 PS2 18 (SUTURE) IMPLANT
SUT VIC AB 0 CT1 36 (SUTURE) ×3 IMPLANT
SUT VIC AB 1 CT1 36 (SUTURE) ×3 IMPLANT
SUT VIC AB 2-0 CT1 27 (SUTURE) ×4
SUT VIC AB 2-0 CT1 TAPERPNT 27 (SUTURE) ×2 IMPLANT
TRAY FOLEY MTR SLVR 16FR STAT (SET/KITS/TRAYS/PACK) ×3 IMPLANT
YANKAUER SUCT BULB TIP 10FT TU (MISCELLANEOUS) ×3 IMPLANT

## 2019-10-26 NOTE — Anesthesia Postprocedure Evaluation (Signed)
Anesthesia Post Note  Patient: Jonathan Navarro  Procedure(s) Performed: LEFT TOTAL HIP ARTHROPLASTY ANTERIOR APPROACH (Left Hip)     Patient location during evaluation: PACU Anesthesia Type: Spinal Level of consciousness: oriented and awake and alert Pain management: pain level controlled Vital Signs Assessment: post-procedure vital signs reviewed and stable Respiratory status: spontaneous breathing, respiratory function stable and nonlabored ventilation Cardiovascular status: blood pressure returned to baseline and stable Postop Assessment: no headache, no backache, no apparent nausea or vomiting, spinal receding and patient able to bend at knees Anesthetic complications: no    Last Vitals:  Vitals:   10/26/19 1415 10/26/19 1430  BP: 138/90 (!) 152/81  Pulse: 68 68  Resp: 17 19  Temp:    SpO2: 95% 95%    Last Pain:  Vitals:   10/26/19 1430  TempSrc:   PainSc: 6                  Abner Ardis A.

## 2019-10-26 NOTE — Anesthesia Preprocedure Evaluation (Addendum)
Anesthesia Evaluation  Patient identified by MRN, date of birth, ID band Patient awake    Reviewed: Allergy & Precautions, NPO status , Patient's Chart, lab work & pertinent test results, reviewed documented beta blocker date and time   Airway Mallampati: II  TM Distance: >3 FB Neck ROM: Full    Dental  (+) Teeth Intact, Caps   Pulmonary former smoker,    Pulmonary exam normal breath sounds clear to auscultation       Cardiovascular hypertension, Pt. on medications and Pt. on home beta blockers Normal cardiovascular exam Rhythm:Regular Rate:Normal     Neuro/Psych Anxiety negative neurological ROS     GI/Hepatic Neg liver ROS, GERD  Medicated and Controlled,  Endo/Other  Obesity Hyperlipidemia  Renal/GU negative Renal ROS  negative genitourinary   Musculoskeletal  (+) Arthritis , Osteoarthritis,  OA left hip   Abdominal (+) + obese,   Peds  Hematology negative hematology ROS (+)   Anesthesia Other Findings   Reproductive/Obstetrics                            Anesthesia Physical Anesthesia Plan  ASA: II  Anesthesia Plan: Spinal   Post-op Pain Management:    Induction: Intravenous  PONV Risk Score and Plan: 2 and Ondansetron, Propofol infusion and Treatment may vary due to age or medical condition  Airway Management Planned: Natural Airway and Simple Face Mask  Additional Equipment:   Intra-op Plan:   Post-operative Plan:   Informed Consent: I have reviewed the patients History and Physical, chart, labs and discussed the procedure including the risks, benefits and alternatives for the proposed anesthesia with the patient or authorized representative who has indicated his/her understanding and acceptance.     Dental advisory given  Plan Discussed with: CRNA and Surgeon  Anesthesia Plan Comments:         Anesthesia Quick Evaluation

## 2019-10-26 NOTE — Brief Op Note (Signed)
10/26/2019  11:56 AM  PATIENT:  Jonathan Navarro  74 y.o. male  PRE-OPERATIVE DIAGNOSIS:  osteoarthritis left hip  POST-OPERATIVE DIAGNOSIS:  osteoarthritis left hip  PROCEDURE:  Procedure(s): LEFT TOTAL HIP ARTHROPLASTY ANTERIOR APPROACH (Left)  SURGEON:  Surgeon(s) and Role:    Kathryne Hitch, MD - Primary  PHYSICIAN ASSISTANT:  Rexene Edison, PA-C  ANESTHESIA:   spinal  EBL:  250 mL   COUNTS:  YES  DICTATION: .Other Dictation: Dictation Number [id number]  PLAN OF CARE: Admit for overnight observation  PATIENT DISPOSITION:  PACU - hemodynamically stable.   Delay start of Pharmacological VTE agent (>24hrs) due to surgical blood loss or risk of bleeding: no

## 2019-10-26 NOTE — Progress Notes (Signed)
Orthopedic Tech Progress Note Patient Details:  Jonathan Navarro 22-Sep-1945 [id number]  Ortho Devices Ortho Device/Splint Location: Trapeze bar Ortho Device/Splint Interventions: Application       Saul Fordyce 10/26/2019, 4:40 PM

## 2019-10-26 NOTE — Transfer of Care (Addendum)
Immediate Anesthesia Transfer of Care Note  Patient: Jonathan Navarro  Procedure(s) Performed: LEFT TOTAL HIP ARTHROPLASTY ANTERIOR APPROACH (Left Hip)  Patient Location: PACU  Anesthesia Type:Spinal  Level of Consciousness: awake, oriented and patient cooperative  Airway & Oxygen Therapy: Patient Spontanous Breathing and Patient connected to face mask oxygen  Post-op Assessment: Report given to RN and Post -op Vital signs reviewed and stable  Post vital signs: stable  Last Vitals:  Vitals Value Taken Time  BP 121/75 10/26/19 1216  Temp    Pulse 49 10/26/19 1221  Resp 9 10/26/19 1221  SpO2 96 % 10/26/19 1221  Vitals shown include unvalidated device data.  Last Pain:  Vitals:   10/26/19 1216  TempSrc:   PainSc: (P) 0-No pain         Complications: No apparent anesthesia complications

## 2019-10-26 NOTE — Evaluation (Signed)
Physical Therapy Evaluation Patient Details Name: Jonathan Navarro MRN: [id number] DOB: [birth year] Today's Date: 10/26/2019   History of Present Illness  Patient is 74 y.o. male s/p L THA anterior approach with PMH significant for HTN, OA, bil RCR and R THR ~2 months ago.  Clinical Impression  Pt s/p L THR and presents with decreased L LE strength/ROM and post op pain limiting functional mobility.  Pt should progress to dc home with family assist.    Follow Up Recommendations Follow surgeon's recommendation for DC plan and follow-up therapies    Equipment Recommendations  None recommended by PT    Recommendations for Other Services       Precautions / Restrictions Precautions Precautions: Fall Restrictions Weight Bearing Restrictions: No Other Position/Activity Restrictions: WBAT      Mobility  Bed Mobility Overal bed mobility: Needs Assistance Bed Mobility: Supine to Sit     Supine to sit: Min assist;Min guard     General bed mobility comments: Pt performed modified roll to move to EOB sitting  Transfers Overall transfer level: Needs assistance Equipment used: Rolling walker (2 wheeled) Transfers: Sit to/from Stand Sit to Stand: Min assist;Mod assist         General transfer comment: cues for LE management and use of UEs to self assist  Ambulation/Gait Ambulation/Gait assistance: Min assist;Min guard Gait Distance (Feet): 150 Feet Assistive device: Rolling walker (2 wheeled) Gait Pattern/deviations: Step-to pattern;Step-through pattern;Decreased step length - right;Decreased step length - left;Shuffle;Trunk flexed Gait velocity: decr   General Gait Details: cues for posture, position from RW and initial sequence  Stairs            Wheelchair Mobility    Modified Rankin (Stroke Patients Only)       Balance Overall balance assessment: Needs assistance Sitting-balance support: No upper extremity supported;Feet supported Sitting balance-Leahy Scale:  Good     Standing balance support: Bilateral upper extremity supported Standing balance-Leahy Scale: Fair                               Pertinent Vitals/Pain Pain Assessment: Faces Faces Pain Scale: Hurts even more Pain Location: R hip Pain Descriptors / Indicators: Aching;Burning;Sore Pain Intervention(s): Limited activity within patient's tolerance;Monitored during session;Premedicated before session;Ice applied    Home Living Family/patient expects to be discharged to:: Private residence Living Arrangements: Spouse/significant other Available Help at Discharge: Family Type of Home: House Home Access: Stairs to enter Entrance Stairs-Rails: None Entrance Stairs-Number of Steps: 1 Home Layout: One level;Laundry or work area in [employer] Equipment: Environmental consultant - 2 wheels;Cane - single point;Bedside commode      Prior Function Level of Independence: Independent               Hand Dominance   Dominant Hand: Right    Extremity/Trunk Assessment   Upper Extremity Assessment Upper Extremity Assessment: Overall WFL for tasks assessed    Lower Extremity Assessment Lower Extremity Assessment: RLE deficits/detail    Cervical / Trunk Assessment Cervical / Trunk Assessment: Normal  Communication   Communication: No difficulties  Cognition Arousal/Alertness: Awake/alert Behavior During Therapy: WFL for tasks assessed/performed Overall Cognitive Status: Within Functional Limits for tasks assessed                                        General Comments      Exercises  Total Joint Exercises Ankle Circles/Pumps: AROM;Both;15 reps;Supine   Assessment/Plan    PT Assessment Patient needs continued PT services  PT Problem List Decreased strength;Decreased range of motion;Decreased activity tolerance;Decreased balance;Decreased mobility;Decreased knowledge of use of DME;Pain       PT Treatment Interventions DME instruction;Gait  training;Stair training;Functional mobility training;Therapeutic activities;Therapeutic exercise;Patient/family education    PT Goals (Current goals can be found in the Care Plan section)  Acute Rehab PT Goals Patient Stated Goal: Regain IND and get back to golfing PT Goal Formulation: With patient Time For Goal Achievement: 11/02/19 Potential to Achieve Goals: Good    Frequency 7X/week   Barriers to discharge        Co-evaluation               AM-PAC PT "6 Clicks" Mobility  Outcome Measure Help needed turning from your back to your side while in a flat bed without using bedrails?: A Little Help needed moving from lying on your back to sitting on the side of a flat bed without using bedrails?: A Little Help needed moving to and from a bed to a chair (including a wheelchair)?: A Little Help needed standing up from a chair using your arms (e.g., wheelchair or bedside chair)?: A Little Help needed to walk in hospital room?: A Little Help needed climbing 3-5 steps with a railing? : A Little 6 Click Score: 18    End of Session Equipment Utilized During Treatment: Gait belt Activity Tolerance: Patient tolerated treatment well;Patient limited by fatigue Patient left: in chair;with call bell/phone within reach;with chair alarm set;with family/visitor present Nurse Communication: Mobility status PT Visit Diagnosis: Difficulty in walking, not elsewhere classified (R26.2)    Time: 1607-3710 PT Time Calculation (min) (ACUTE ONLY): 35 min   Charges:   PT Evaluation $PT Eval Low Complexity: 1 Low PT Treatments $Gait Training: 8-22 mins        [place] Pager [phone] Office [phone]   Alsha Meland 10/26/2019, 5:07 PM

## 2019-10-26 NOTE — Plan of Care (Signed)

## 2019-10-26 NOTE — Anesthesia Procedure Notes (Signed)
Procedure Name: MAC Date/Time: 10/26/2019 10:43 AM Performed by: Josephine Igo, MD Pre-anesthesia Checklist: Patient identified, Emergency Drugs available, Suction available, Patient being monitored and Timeout performed Patient Re-evaluated:Patient Re-evaluated prior to induction Oxygen Delivery Method: Simple face mask Placement Confirmation: positive ETCO2

## 2019-10-26 NOTE — Interval H&P Note (Signed)
History and Physical Interval Note: The patient understands that he is here for a left total hip arthroplasty today.  There has been no acute change in medical status.  See recent H&P.  The risk and benefits of surgery been explained in detail and informed consent is obtained.  The left hip has been marked.  10/26/2019 9:47 AM  [name]  has presented today for surgery, with the diagnosis of osteoarthritis left hip.  The various methods of treatment have been discussed with the patient and family. After consideration of risks, benefits and other options for treatment, the patient has consented to  Procedure(s): LEFT TOTAL HIP ARTHROPLASTY ANTERIOR APPROACH (Left) as a surgical intervention.  The patient's history has been reviewed, patient examined, no change in status, stable for surgery.  I have reviewed the patient's chart and labs.  Questions were answered to the patient's satisfaction.     Kathryne Hitch

## 2019-10-26 NOTE — Op Note (Signed)
NAMELarri Navarro, Jonathan Navarro MEDICAL RECORD [id number] ACCOUNT [id number] DATE OF BIRTH:[birth year] FACILITY: [facility] LOCATION: [facility] PHYSICIAN:Gilliam Hawkes Kerry Fort, MD  OPERATIVE REPORT  DATE OF PROCEDURE:  10/26/2019  PREOPERATIVE DIAGNOSIS:  Severe end-stage arthritis and degenerative joint disease, left hip.  POSTOPERATIVE DIAGNOSIS:  Severe end-stage arthritis and degenerative joint disease, left hip.  PROCEDURE:  Left total hip arthroplasty through direct anterior approach.  IMPLANTS:  DePuy Sector Gription acetabular component size 56, size 36+4 neutral polyethylene liner, size 14 Corail femoral component with standard offset, size 36+8.5 metal hip ball.  SURGEON:  Lind Guest.  Ninfa Linden, MD  ASSISTANT:  Erskine Emery, PA-C.  ANESTHESIA:  Spinal.  ANTIBIOTICS:  Two g IV Ancef.  ESTIMATED BLOOD LOSS:  250 mL.  COMPLICATIONS:  None.  INDICATIONS:  The patient is a 74 year old gentleman well known to me.  He actually has severe debilitating arthritis involving both his hips.  We actually replaced his right hip in just March of this year.  He has end-stage arthritis of his left hip and  his left hip is severely causing him pain and this is detrimentally affecting his mobility, his quality of life, as well as his activities of daily living.  Given the success of his right total hip arthroplasty, he wishes to go ahead and have the left  one replaced.  The risks and benefits of surgery have been explained to him in detail.  Having had this done before, he is fully aware of the risk of acute blood loss anemia, nerve or vessel injury, fracture, infection, dislocation, DVT and implant  failure.  He understands our goals are to decrease pain, improve mobility and overall improve quality of life.  DESCRIPTION OF PROCEDURE:  After informed consent was obtained and appropriate left hip was marked, he was brought to the operating room and sat up on a stretcher where spinal anesthesia was  obtained.  He was laid supine on the stretcher and a Foley  catheter was placed.  I was able to assess his leg lengths and he is definitely shorter on his left operative side due to the severity of his arthritis and the fact that we replaced his right hip.  Traction boots were placed on both his feet.  Next, he  was placed supine on the Hana fracture table, the perineal post in place and both legs in line skeletal traction device and no traction applied.  His left operative hip was prepped and draped with DuraPrep and sterile drapes.  A time-out was called.  He  was identified as correct patient, correct left hip.  I then made an incision just inferior and posterior to the anterior superior iliac spine and carried this obliquely down the leg.  We dissected down to the tensor fascia lata muscle.  Tensor fascia  was then divided longitudinally to proceed with direct anterior approach to the hip.  We identified and cauterized circumflex vessels and identified the hip capsule, opened the hip capsule in an L-type format, finding a moderate joint effusion and  significant disease of arthritis around the femoral head and neck.  We placed Cobra retractors around the medial and lateral femoral neck and made our femoral neck cut with an oscillating saw just proximal to the lesser trochanter.  We completed this  with an osteotome and placed a corkscrew guide in the femoral head and removed the femoral head in its entirety.  We found a wide area devoid of cartilage.  We then placed a bent Hohmann over the medial acetabular  rim and removed remnants of the  acetabular labrum and other debris from the hip.  We then began reaming under direct visualization from a size 44 reamer in stepwise increments, going up to a size 55, with all reamers under direct visualization, the last reamer under direct fluoroscopy,  so we could obtain our depth of reaming, our inclination and anteversion.  I then placed the real DePuy Sector  Gription acetabular component size 56 and went with a 36+4 neutral polyethylene liner given his offset.  Attention was then turned to the  femur.  With the leg externally rotated to 120 degrees, extended and adducted, we were able to place a Mueller retractor medially and  Hohmann retractor behind the greater trochanter  We released the lateral joint capsule and used a box-cutting osteotome  to enter the femoral canal and a rongeur to lateralize.  We then began broaching using the Corail broaching system from a size 8 going up to a size 14.  With a size 14 in place, we trialed a standard offset femoral neck and a 36+1.5 hip ball, reduced  this in the acetabulum and we could tell right away we needed more offset and leg length.  The position of the stem though looked good in terms of its filling the femoral canal.  We dislocated the hip and removed the trial components.  We then placed the  real Corail femoral component size 14 with standard offset.  We went with a 36+8.5 real metal hip ball.  We reduced this in the acetabulum and I was pleased with leg length, offset, range of motion and stability assessed radiographically and  mechanically.  We then irrigated the soft tissue with normal saline solution using pulsatile lavage.  We closed the joint capsule with interrupted #1 Ethibond suture, followed by closing the tensor fascia with #1 Vicryl.  Zero Vicryl was used to close  deep tissue, 2-0 Vicryl was used to close subcutaneous tissue and interrupted staples were used to reapproximate the skin.  Xeroform and Aquacel dressing was applied.  He was taken off the Hana table and taken to the recovery room in stable condition.   All final counts were correct.  There were no complications noted.  Of note, Rexene Edison, PA-C, assisted during the entire case and his assistance was crucial for facilitating all aspects of this case.  VN/NUANCE  D:10/26/2019 T:10/26/2019 JOB:[id number]

## 2019-10-26 NOTE — Anesthesia Procedure Notes (Addendum)
Spinal  Patient location during procedure: OR End time: 10/26/2019 10:51 AM Staffing Performed: resident/CRNA  Resident/CRNA: Illene Silver, CRNA Preanesthetic Checklist Completed: patient identified, IV checked, site marked, risks and benefits discussed, surgical consent, monitors and equipment checked, pre-op evaluation and timeout performed Spinal Block Patient position: sitting Prep: DuraPrep Patient monitoring: heart rate, cardiac monitor, continuous pulse ox and blood pressure Approach: midline Location: L3-4 Injection technique: single-shot Needle Needle type: Pencan  Needle gauge: 24 G Needle length: 9 cm Assessment Sensory level: T4 Additional Notes Drugs within expiration date.

## 2019-10-27 ENCOUNTER — Observation Stay (HOSPITAL_COMMUNITY): Payer: Medicare Other

## 2019-10-27 DIAGNOSIS — M1612 Unilateral primary osteoarthritis, left hip: Secondary | ICD-10-CM | POA: Diagnosis not present

## 2019-10-27 LAB — BASIC METABOLIC PANEL
Anion gap: 7 (ref 5–15)
BUN: 16 mg/dL (ref 8–23)
CO2: 27 mmol/L (ref 22–32)
Calcium: 8.7 mg/dL — ABNORMAL LOW (ref 8.9–10.3)
Chloride: 104 mmol/L (ref 98–111)
Creatinine, Ser: 0.86 mg/dL (ref 0.61–1.24)
GFR calc Af Amer: >60 mL/min (ref >60)
GFR calc non Af Amer: >60 mL/min (ref >60)
Glucose, Bld: 137 mg/dL — ABNORMAL HIGH (ref 70–99)
Potassium: 4.3 mmol/L (ref 3.5–5.1)
Sodium: 138 mmol/L (ref 135–145)

## 2019-10-27 LAB — CBC
HCT: 35.9 % — ABNORMAL LOW (ref 39.0–52.0)
Hemoglobin: 12.1 g/dL — ABNORMAL LOW (ref 13.0–17.0)
MCH: 30.7 pg (ref 26.0–34.0)
MCHC: 33.7 g/dL (ref 30.0–36.0)
MCV: 91.1 fL (ref 80.0–100.0)
Platelets: 184 10*3/uL (ref 150–400)
RBC: 3.94 MIL/uL — ABNORMAL LOW (ref 4.22–5.81)
RDW: 12.5 % (ref 11.5–15.5)
WBC: 10.4 10*3/uL (ref 4.0–10.5)
nRBC: 0 % (ref 0.0–0.2)

## 2019-10-27 MED ORDER — DILTIAZEM HCL ER COATED BEADS 240 MG PO CP24
360.0000 mg | ORAL_CAPSULE | Freq: Every day | ORAL | Status: DC
  Administered 2019-10-27: 360 mg via ORAL
  Filled 2019-10-27: qty 1

## 2019-10-27 MED ORDER — FOLIC ACID 1 MG PO TABS
500.0000 ug | ORAL_TABLET | Freq: Every day | ORAL | Status: DC
  Administered 2019-10-27: 0.5 mg via ORAL
  Filled 2019-10-27: qty 1

## 2019-10-27 MED ORDER — ASPIRIN 81 MG PO CHEW: 81.0000 mg | CHEWABLE_TABLET | Freq: Two times a day (BID) | ORAL | 0 refills | Status: AC

## 2019-10-27 MED ORDER — DILTIAZEM HCL ER BEADS 240 MG PO CP24: 360.0000 mg | ORAL_CAPSULE | Freq: Every day | ORAL | Status: DC

## 2019-10-27 MED ORDER — METHOCARBAMOL 500 MG PO TABS: 500.0000 mg | ORAL_TABLET | Freq: Four times a day (QID) | ORAL | 1 refills | Status: DC | PRN

## 2019-10-27 MED ORDER — OXYCODONE HCL 5 MG PO TABS: 5.0000 mg | ORAL_TABLET | Freq: Four times a day (QID) | ORAL | 0 refills | Status: DC | PRN

## 2019-10-27 NOTE — Progress Notes (Signed)
Patient ID: Jonathan Navarro, male   DOB: Feb 17, 1946, 73 y.o.   MRN: [id number] His O2 sats are now 95%.  He is doing well.  Therapy plans to work with him again later.   His wife is at the bedside.  They would prefer to go home later this afternoon if he is doing well.

## 2019-10-27 NOTE — Discharge Instructions (Signed)

## 2019-10-27 NOTE — Discharge Summary (Signed)
Patient ID: Jonathan Navarro MRN: [id number] DOB/AGE: [birth year] 74 y.o.  Admit date: 10/26/2019 Discharge date: 10/27/2019  Admission Diagnoses:  Principal Problem:   Unilateral primary osteoarthritis, left hip Active Problems:   Status post total replacement of left hip   Discharge Diagnoses:  Same  Past Medical History:  Diagnosis Date  . Anxiety   . Arthritis   . GERD (gastroesophageal reflux disease)   . Hypertension   . SVT (supraventricular tachycardia) (HCC)     Surgeries: Procedure(s): LEFT TOTAL HIP ARTHROPLASTY ANTERIOR APPROACH on 10/26/2019   Consultants:   Discharged Condition: Improved  Hospital Course: Jonathan Navarro is an 74 y.o. male who was admitted 10/26/2019 for operative treatment ofUnilateral primary osteoarthritis, left hip. Patient has severe unremitting pain that affects sleep, daily activities, and work/hobbies. After pre-op clearance the patient was taken to the operating room on 10/26/2019 and underwent  Procedure(s): LEFT TOTAL HIP ARTHROPLASTY ANTERIOR APPROACH.    Patient was given perioperative antibiotics:  Anti-infectives (From admission, onward)   Start     Dose/Rate Route Frequency Ordered Stop   10/26/19 0845  ceFAZolin (ANCEF) IVPB 2g/100 mL premix     2 g 200 mL/hr over 30 Minutes Intravenous On call to O.R. 10/26/19 0830 10/26/19 1044       Patient was given sequential compression devices, early ambulation, and chemoprophylaxis to prevent DVT.  Patient benefited maximally from hospital stay and there were no complications.    Recent vital signs:  Patient Vitals for the past 24 hrs:  BP Temp Temp src Pulse Resp SpO2 Height Weight  10/27/19 0924 130/87 97.6 F (36.4 C) Oral 94 15 98 % -- --  10/27/19 0515 (!) 161/87 98.2 F (36.8 C) -- 91 15 92 % -- --  10/27/19 0102 (!) 159/89 98.5 F (36.9 C) -- 95 15 95 % -- --  10/26/19 2153 (!) 156/96 98.8 F (37.1 C) -- 85 15 96 % -- --  10/26/19 1841 140/82 99.2 F (37.3 C) Oral 82 18 95 % --  --  10/26/19 1600 -- -- -- -- -- -- 6' (1.829 m) 104.4 kg  10/26/19 1523 (!) 157/86 97.9 F (36.6 C) -- 69 15 91 % -- --  10/26/19 1445 140/90 98.3 F (36.8 C) -- 62 (!) 9 93 % -- --  10/26/19 1430 (!) 152/81 -- -- 68 19 95 % -- --  10/26/19 1415 138/90 -- -- 68 17 95 % -- --  10/26/19 1400 (!) 170/87 -- -- 64 13 96 % -- --  10/26/19 1345 (!) 140/91 -- -- 62 13 98 % -- --  10/26/19 1330 137/87 -- -- (!) 56 14 94 % -- --  10/26/19 1315 140/87 -- -- (!) 56 14 96 % -- --  10/26/19 1300 130/86 -- -- (!) 51 10 96 % -- --  10/26/19 1245 127/83 -- -- (!) 53 10 96 % -- --  10/26/19 1230 131/76 -- -- (!) 51 (!) 9 97 % -- --  10/26/19 1216 121/75 (!) 97.5 F (36.4 C) -- (!) 52 12 96 % -- --     Recent laboratory studies:  Recent Labs    10/27/19 0325  WBC 10.4  HGB 12.1*  HCT 35.9*  PLT 184  NA 138  K 4.3  CL 104  CO2 27  BUN 16  CREATININE 0.86  GLUCOSE 137*  CALCIUM 8.7*     Discharge Medications:   Allergies as of 10/27/2019      Reactions   Caffeine  Heart racing   Codeine Nausea Only   Nsaids    Severe irregular heartbeat      Medication List    STOP taking these medications   aspirin EC 81 MG tablet Replaced by: aspirin 81 MG chewable tablet     TAKE these medications   ALPRAZolam 1 MG tablet Commonly known as: XANAX Take 1 mg by mouth 2 (two) times daily as needed for anxiety.   aspirin 81 MG chewable tablet Chew 1 tablet (81 mg total) by mouth 2 (two) times daily. Replaces: aspirin EC 81 MG tablet   Cholecalciferol 50 MCG (2000 UT) Caps Take 2,000 Units by mouth daily.   cholestyramine 4 GM/DOSE powder Commonly known as: QUESTRAN Take 4 g by mouth daily as needed.   diltiazem 360 MG 24 hr capsule Commonly known as: TIAZAC Take 360 mg by mouth daily.   esomeprazole 40 MG capsule Commonly known as: NEXIUM Take 40 mg by mouth daily before breakfast.   folic acid 400 MCG tablet Commonly known as: FOLVITE Take 400 mcg by mouth daily.    methocarbamol 500 MG tablet Commonly known as: ROBAXIN Take 1 tablet (500 mg total) by mouth every 6 (six) hours as needed for muscle spasms.   metoprolol tartrate 50 MG tablet Commonly known as: LOPRESSOR Take 50 mg by mouth 2 (two) times daily.   MULTIVITAMIN ADULT PO Take 1 tablet by mouth daily.   OVER THE COUNTER MEDICATION Take 1 capsule by mouth daily. pyrroloquinoline quinone (PQQ)   oxyCODONE 5 MG immediate release tablet Commonly known as: Oxy IR/ROXICODONE Take 1-2 tablets (5-10 mg total) by mouth every 6 (six) hours as needed for moderate pain (pain score 4-6).   sucralfate 1 g tablet Commonly known as: CARAFATE Take 1 g by mouth 3 (three) times daily as needed (stomach acid).            Durable Medical Equipment  (From admission, onward)         Start     Ordered   10/26/19 1456  DME 3 n 1  Once     10/26/19 1455   10/26/19 1456  DME Walker rolling  Once    Question Answer Comment  Walker: With 5 Inch Wheels   Patient needs a walker to treat with the following condition Status post total replacement of left hip      10/26/19 1455          Diagnostic Studies: DG Pelvis Portable  Result Date: 10/26/2019 CLINICAL DATA:  Status post left total hip replacement EXAM: PORTABLE PELVIS 1-2 VIEWS COMPARISON:  Pelvis July 25, 2019; right hip radiographs August 17, 2019; intraoperative left hip radiograph October 26, 2019 FINDINGS: A portion of the upper pelvis not visualized. There are now total hip replacements bilaterally with prosthetic components well-seated on frontal view. No fracture or dislocation. No erosive change. IMPRESSION: Status post total hip replacements bilaterally with prosthetic components bilaterally well-seated. No fracture or dislocation. Electronically Signed   By: Bretta Bang III M.D.   On: 10/26/2019 13:46   DG C-Arm 1-60 Min-No Report  Result Date: 10/26/2019 Fluoroscopy was utilized by the requesting physician.  No radiographic  interpretation.   DG HIP OPERATIVE UNILAT W OR W/O PELVIS LEFT  Result Date: 10/26/2019 CLINICAL DATA:  Status post left total hip replacement EXAM: OPERATIVE LEFT HIP (WITH PELVIS IF PERFORMED) 1 VIEWS TECHNIQUE: Fluoroscopic spot image(s) were submitted for interpretation post-operatively. COMPARISON:  July 25, 2019 FLUOROSCOPY TIME:  0 minutes 14  seconds; 0.28 mGy; 2 acquired images FINDINGS: Frontal view shows a total hip replacement on the left with prosthetic components well seated. A second image shows total hip replacements bilaterally with prosthetic components bilaterally well-seated. No fracture or dislocation. IMPRESSION: Total hip replacements bilaterally with prosthetic components well-seated. No evident fracture or dislocation. Electronically Signed   By: Lowella Grip III M.D.   On: 10/26/2019 13:47    Disposition: Discharge disposition: 01-Home or [place]    Mcarthur Rossetti, MD Follow up in 2 week(s).   Specialty: Orthopedic Surgery Contact information: [address] [place] [address] [phone]            Signed: Mcarthur Rossetti 10/27/2019, 10:04 AM

## 2019-10-27 NOTE — TOC Initial Note (Signed)
Transition of Care [facility]) - Initial/Assessment Note    Patient Details  Name: Jonathan Navarro MRN: [id number] Date of Birth: 05/31/1945  Transition of Care [facility]) CM/SW Contact:    Armanda Heritage, RN Phone Number: 10/27/2019, 11:21 AM  Clinical Narrative:        CM spoke with patient at bedside.  Patient reports he completed HEP with his last surgery and would like to do HEP again.  MD was notified of this.  Patient reports he has all needed dme at home from his previous surgery.              Expected Discharge Plan: Home/Self Care Barriers to Discharge: No Barriers Identified   Patient Goals and CMS Choice Patient states their goals for this hospitalization and ongoing recovery are:: To go home with home exercise program      Expected Discharge Plan and Services Expected Discharge Plan: Home/Self Care   Discharge Planning Services: CM Consult   Living arrangements for the past 2 months: Single Family Home Expected Discharge Date: 10/27/19               DME Arranged: N/A DME Agency: NA       HH Arranged: NA HH Agency: NA        Prior Living Arrangements/Services Living arrangements for the past 2 months: Single Family Home   Patient language and need for interpreter reviewed:: Yes Do you feel safe going back to the place where you live?: Yes      Need for Family Participation in Patient Care: No (Comment) Care giver support system in place?: Yes (comment)   Criminal Activity/Legal Involvement Pertinent to Current Situation/Hospitalization: No - Comment as needed  Activities of Daily Living Home Assistive Devices/Equipment: Eyeglasses, Blood pressure cuff, Walker (specify type), Cane (specify quad or straight), Raised toilet seat with rails ADL Screening (condition at time of admission) Patient's cognitive ability adequate to safely complete daily activities?: Yes Is the patient deaf or have difficulty hearing?: No Does the patient have difficulty seeing, even  when wearing glasses/contacts?: No Does the patient have difficulty concentrating, remembering, or making decisions?: No Patient able to express need for assistance with ADLs?: Yes Does the patient have difficulty dressing or bathing?: No Independently performs ADLs?: Yes (appropriate for developmental age) Does the patient have difficulty walking or climbing stairs?: Yes Weakness of Legs: Left Weakness of Arms/Hands: None  Permission Sought/Granted                  Emotional Assessment Appearance:: Appears stated age Attitude/Demeanor/Rapport: Engaged Affect (typically observed): Accepting Orientation: : Oriented to Self, Oriented to Place, Oriented to  Time, Oriented to Situation   Psych Involvement: No (comment)  Admission diagnosis:  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 Status post total replacement of left hip 10/26/2019  . Status post total replacement of right hip 08/17/2019  . Unilateral primary osteoarthritis, left hip 07/25/2019  . Unilateral primary osteoarthritis, right hip 07/25/2019   PCP:  Jolee Ewing, NP Pharmacy:   [facility] [phone] - [place], [place] - [address] AT [address] [place] [address] Phone: [phone] Fax: [phone]     Social Determinants of Health (SDOH) Interventions    Readmission Risk Interventions No flowsheet data found.

## 2019-10-27 NOTE — Progress Notes (Signed)
Physical Therapy Treatment Patient Details Name: Jonathan Navarro MRN: [id number] DOB: 1945-09-01 Today's Date: 10/27/2019    History of Present Illness Patient is 74 y.o. male s/p L THA anterior approach with PMH significant for HTN, OA, bil RCR and R THR ~2 months ago.    PT Comments    Pt motivated to progress and dc home this date.  Pt ambulated increased distance in hall and reviewed bed mobility, stairs and car transfers.  Pt maintained SaO2 at 94% or higher on RA throughout session.   Follow Up Recommendations  Follow surgeon's recommendation for DC plan and follow-up therapies     Equipment Recommendations  None recommended by PT    Recommendations for Other Services       Precautions / Restrictions Precautions Precautions: Fall Restrictions Weight Bearing Restrictions: No Other Position/Activity Restrictions: WBAT    Mobility  Bed Mobility Overal bed mobility: Needs Assistance Bed Mobility: Sit to Supine     Supine to sit: Min guard;Supervision Sit to supine: Supervision   General bed mobility comments: pt performing modified roll and assisting L LE with UEs  Transfers Overall transfer level: Needs assistance Equipment used: Rolling walker (2 wheeled) Transfers: Sit to/from Stand Sit to Stand: Supervision         General transfer comment: cues for LE management and use of UEs to self assist  Ambulation/Gait Ambulation/Gait assistance: Min guard;Supervision Gait Distance (Feet): 300 Feet Assistive device: Rolling walker (2 wheeled) Gait Pattern/deviations: Step-to pattern;Step-through pattern;Decreased step length - right;Decreased step length - left;Shuffle;Trunk flexed Gait velocity: decr   General Gait Details: cues for posture, position from RW and initial sequence   Stairs Stairs: Yes Stairs assistance: Min guard Stair Management: No rails;Step to pattern;Forwards;With walker Number of Stairs: 2 General stair comments: single step twice with  cues for sequence and foot/RW placement   Wheelchair Mobility    Modified Rankin (Stroke Patients Only)       Balance Overall balance assessment: Needs assistance Sitting-balance support: No upper extremity supported;Feet supported Sitting balance-Leahy Scale: Good     Standing balance support: Bilateral upper extremity supported Standing balance-Leahy Scale: Fair                              Cognition Arousal/Alertness: Awake/alert Behavior During Therapy: WFL for tasks assessed/performed Overall Cognitive Status: Within Functional Limits for tasks assessed                                        Exercises Total Joint Exercises Ankle Circles/Pumps: AROM;Both;15 reps;Supine Quad Sets: AROM;Both;Supine;10 reps Heel Slides: AAROM;Left;20 reps;Supine Hip ABduction/ADduction: AAROM;Left;15 reps;Supine    General Comments        Pertinent Vitals/Pain Pain Assessment: 0-10 Pain Score: 6  Pain Location: R hip Pain Descriptors / Indicators: Aching;Burning;Sore Pain Intervention(s): Limited activity within patient's tolerance;Monitored during session;Ice applied;Patient requesting pain meds-RN notified    Home Living                      Prior Function            PT Goals (current goals can now be found in the care plan section) Acute Rehab PT Goals Patient Stated Goal: Regain IND and get back to golfing PT Goal Formulation: With patient Time For Goal Achievement: 11/02/19 Potential to Achieve Goals: Good Progress towards PT  goals: Progressing toward goals    Frequency    7X/week      PT Plan Current plan remains appropriate    Co-evaluation              AM-PAC PT "6 Clicks" Mobility   Outcome Measure  Help needed turning from your back to your side while in a flat bed without using bedrails?: A Little Help needed moving from lying on your back to sitting on the side of a flat bed without using bedrails?: A  Little Help needed moving to and from a bed to a chair (including a wheelchair)?: A Little Help needed standing up from a chair using your arms (e.g., wheelchair or bedside chair)?: A Little Help needed to walk in hospital room?: A Little Help needed climbing 3-5 steps with a railing? : A Little 6 Click Score: 18    End of Session Equipment Utilized During Treatment: Gait belt Activity Tolerance: Patient tolerated treatment well Patient left: in bed;with call bell/phone within reach;with family/visitor present Nurse Communication: Mobility status PT Visit Diagnosis: Difficulty in walking, not elsewhere classified (R26.2)     Time: 4650-3546 PT Time Calculation (min) (ACUTE ONLY): 33 min  Charges:  $Gait Training: 8-22 mins $Therapeutic Exercise: 8-22 mins $Therapeutic Activity: 8-22 mins                     Debe Coder PT Acute Rehabilitation Services Pager [phone] Office [phone]    Mandisa Persinger 10/27/2019, 11:25 AM

## 2019-10-27 NOTE — Progress Notes (Signed)
Physical Therapy Treatment Patient Details Name: Jonathan Navarro MRN: [id number] DOB: 27-Oct-1945 Today's Date: 10/27/2019    History of Present Illness Patient is 74 y.o. male s/p L THA anterior approach with PMH significant for HTN, OA, bil RCR and R THR ~2 months ago.    PT Comments    Pt performed HEP with assist and then up to chair for bfast.  Pt states he performed own PT after last hip and feels comfortable doing same with this THR.  Pt states he has all literature from R THR which was performed 2 months ago.   Follow Up Recommendations  Follow surgeon's recommendation for DC plan and follow-up therapies     Equipment Recommendations  None recommended by PT    Recommendations for Other Services       Precautions / Restrictions Precautions Precautions: Fall Restrictions Weight Bearing Restrictions: No Other Position/Activity Restrictions: WBAT    Mobility  Bed Mobility Overal bed mobility: Needs Assistance Bed Mobility: Supine to Sit     Supine to sit: Min guard;Supervision     General bed mobility comments: min cues for sequence  Transfers Overall transfer level: Needs assistance Equipment used: Rolling walker (2 wheeled) Transfers: Sit to/from Stand Sit to Stand: Min assist;Min guard         General transfer comment: cues for LE management and use of UEs to self assist  Ambulation/Gait Ambulation/Gait assistance: Min guard;Supervision Gait Distance (Feet): 8 Feet Assistive device: Rolling walker (2 wheeled) Gait Pattern/deviations: Step-to pattern;Step-through pattern;Decreased step length - right;Decreased step length - left;Shuffle;Trunk flexed Gait velocity: decr   General Gait Details: cues for posture, position from RW and initial sequence   Stairs             Wheelchair Mobility    Modified Rankin (Stroke Patients Only)       Balance Overall balance assessment: Needs assistance Sitting-balance support: No upper extremity  supported;Feet supported Sitting balance-Leahy Scale: Good     Standing balance support: Bilateral upper extremity supported Standing balance-Leahy Scale: Fair                              Cognition Arousal/Alertness: Awake/alert Behavior During Therapy: WFL for tasks assessed/performed Overall Cognitive Status: Within Functional Limits for tasks assessed                                        Exercises Total Joint Exercises Ankle Circles/Pumps: AROM;Both;15 reps;Supine Quad Sets: AROM;Both;Supine;10 reps Heel Slides: AAROM;Left;20 reps;Supine Hip ABduction/ADduction: AAROM;Left;15 reps;Supine    General Comments        Pertinent Vitals/Pain Pain Assessment: 0-10 Pain Score: 4  Pain Location: R hip Pain Descriptors / Indicators: Aching;Burning;Sore Pain Intervention(s): Limited activity within patient's tolerance;Monitored during session(pt declined premed)    Home Living                      Prior Function            PT Goals (current goals can now be found in the care plan section) Acute Rehab PT Goals Patient Stated Goal: Regain IND and get back to golfing PT Goal Formulation: With patient Time For Goal Achievement: 11/02/19 Potential to Achieve Goals: Good Progress towards PT goals: Progressing toward goals    Frequency    7X/week      PT Plan  Current plan remains appropriate    Co-evaluation              AM-PAC PT "6 Clicks" Mobility   Outcome Measure  Help needed turning from your back to your side while in a flat bed without using bedrails?: A Little Help needed moving from lying on your back to sitting on the side of a flat bed without using bedrails?: A Little Help needed moving to and from a bed to a chair (including a wheelchair)?: A Little Help needed standing up from a chair using your arms (e.g., wheelchair or bedside chair)?: A Little Help needed to walk in hospital room?: A Little Help needed  climbing 3-5 steps with a railing? : A Little 6 Click Score: 18    End of Session Equipment Utilized During Treatment: Gait belt Activity Tolerance: Patient tolerated treatment well Patient left: in chair;with call bell/phone within reach;with chair alarm set;with family/visitor present Nurse Communication: Mobility status PT Visit Diagnosis: Difficulty in walking, not elsewhere classified (R26.2)     Time: 2130-8657 PT Time Calculation (min) (ACUTE ONLY): 15 min  Charges:  $Therapeutic Exercise: 8-22 mins                     Debe Coder PT Acute Rehabilitation Services Pager [phone] Office [phone]    Kinjal Neitzke 10/27/2019, 11:20 AM

## 2019-10-27 NOTE — Progress Notes (Signed)
     Subjective: 1 Day Post-Op Procedure(s) (LRB): LEFT TOTAL HIP ARTHROPLASTY ANTERIOR APPROACH (Left)Left total hip replacement, dressing dry, He had foley discontinued yesterday evening and was walked in the hallway. Voiding without difficulty. Nursing noted O2 saturation low at 88, observed for a few minutes and remained unchanged so he was placed on o2 per nasal cannula. Using IS. No cough or fever or chills. Smoked in the past but stopped in 1984.  Patient reports pain as moderate.    Objective:   VITALS:  Temp:  [97.5 F (36.4 C)-99.2 F (37.3 C)] 97.6 F (36.4 C) (06/05 0924) Pulse Rate:  [51-95] 94 (06/05 0924) Resp:  [9-19] 15 (06/05 0924) BP: (121-170)/(75-96) 130/87 (06/05 0924) SpO2:  [91 %-98 %] 98 % (06/05 0924) Weight:  [104.4 kg] 104.4 kg (06/04 1600)  Neurologically intact ABD soft Neurovascular intact Sensation intact distally Intact pulses distally Dorsiflexion/Plantar flexion intact Incision: dressing C/D/I and no drainage No cellulitis present Compartment soft   LABS Recent Labs    10/27/19 0325  HGB 12.1*  WBC 10.4  PLT 184   Recent Labs    10/27/19 0325  NA 138  K 4.3  CL 104  CO2 27  BUN 16  CREATININE 0.86  GLUCOSE 137*   No results for input(s): LABPT, INR in the last 72 hours.   Assessment/Plan: 1 Day Post-Op Procedure(s) (LRB): LEFT TOTAL HIP ARTHROPLASTY ANTERIOR APPROACH (Left)  Advance diet Up with therapy D/C IV fluids Plan for discharge tomorrow Discharge home with home health  Need to assess cause of low O2. Will check CXR for edema or atelectasis. IS and cough and deep breath every hour while awake.    Vira Browns 10/27/2019, 9:29 AMPatient ID: Jonathan Navarro, male   DOB: 04-26-1946, 74 y.o.   MRN: [id number]

## 2019-10-27 NOTE — Plan of Care (Signed)
Patient discharged home in stable condition 

## 2019-10-29 ENCOUNTER — Encounter: Payer: Self-pay | Admitting: *Deleted

## 2019-11-12 ENCOUNTER — Inpatient Hospital Stay: Payer: Medicare Other | Admitting: Orthopaedic Surgery

## 2019-11-13 ENCOUNTER — Other Ambulatory Visit: Payer: Self-pay

## 2019-11-13 ENCOUNTER — Encounter: Payer: Self-pay | Admitting: Orthopaedic Surgery

## 2019-11-13 ENCOUNTER — Ambulatory Visit (INDEPENDENT_AMBULATORY_CARE_PROVIDER_SITE_OTHER): Payer: Medicare Other | Admitting: Orthopaedic Surgery

## 2019-11-13 DIAGNOSIS — Z96642 Presence of left artificial hip joint: Secondary | ICD-10-CM

## 2019-11-13 MED ORDER — OXYCODONE HCL 5 MG PO TABS: 5.0000 mg | ORAL_TABLET | Freq: Four times a day (QID) | ORAL | 0 refills | Status: AC | PRN

## 2019-11-13 NOTE — Progress Notes (Signed)
HPI: Mr. Sanfilippo returns status post left total hip arthroplasty 10/26/2019. He is overall doing well. Is ambulate with a rolling walker. He states his range of motion and strength are improving no shortness of breath fevers chills. He has been taking aspirin 81 mg twice daily and is on 81 mg aspirin prior to surgery. Needs refill on oxycodone.  Physical exam: Left hip surgical incisions healing well staples well approximated wound without dehiscence. Slight seroma. No signs of infection. Left calf supple nontender dorsiflexion plantarflexion left ankle intact.  Impression: Status post left total hip arthroplasty 2 weeks  Plan: Staples removed Steri-Strips applied. Seroma was aspirated 40 cc of serosanguineous fluid was obtained patient tolerates well. He'll follow-up with [place] in 1 month sooner if there is any questions concerns. We'll go back to his regular 81 mg aspirin daily.

## 2019-11-24 ENCOUNTER — Other Ambulatory Visit: Payer: Self-pay | Admitting: Orthopaedic Surgery

## 2019-12-12 ENCOUNTER — Ambulatory Visit (INDEPENDENT_AMBULATORY_CARE_PROVIDER_SITE_OTHER): Payer: Medicare Other | Admitting: Physician Assistant

## 2019-12-12 ENCOUNTER — Other Ambulatory Visit: Payer: Self-pay

## 2019-12-12 ENCOUNTER — Encounter: Payer: Self-pay | Admitting: Physician Assistant

## 2019-12-12 VITALS — Ht 72.0 in | Wt 230.1 lb

## 2019-12-12 DIAGNOSIS — Z96642 Presence of left artificial hip joint: Secondary | ICD-10-CM

## 2019-12-12 NOTE — Progress Notes (Signed)
Jonathan Navarro returns today in follow-up of his left total hip arthroplasty 10/26/2019.  He also had right total hip arthroplasty 08/17/2019.  He states both hips are doing well he still has some soreness after sitting walks out quickly.  Is complaining mostly of left knee pain.  Mostly anterior aspect of the knee.  He has had no injury to the knee.  Review of systems: No fevers chills shortness of breath chest pain  Physical exam: Bilateral hips good range of motion without pain.  Surgical incisions are healing well.  There is an area of folliculitis near the incision of the left hip but otherwise no signs of infection.  Bilateral calves are supple nontender.  Bilateral knees good range of motion.  No instability with valgus varus stressing.  Slight quad atrophy left compared to the right.   Impression: Status post left total hip arthroplasty 10/26/2019 Left knee pain   Plan: Have him watch the area folliculitis near the incision closely left surgical incision.  In regards to his knee have him work on quad strengthening exercises were shown and reviewed with the patient.  Like to see him back in 4 weeks see how he is doing overall.  Questions were encouraged and answered if continues to have left knee pain would recommend AP and lateral views of the left knee.

## 2020-01-09 ENCOUNTER — Ambulatory Visit (INDEPENDENT_AMBULATORY_CARE_PROVIDER_SITE_OTHER): Payer: Medicare Other | Admitting: Physician Assistant

## 2020-01-09 ENCOUNTER — Encounter: Payer: Self-pay | Admitting: Physician Assistant

## 2020-01-09 DIAGNOSIS — Z96642 Presence of left artificial hip joint: Secondary | ICD-10-CM

## 2020-01-09 NOTE — Progress Notes (Signed)
HPI: Jonathan Navarro returns today follow-up of his left total hip arthroplasty.  He underwent a left total hip arthroplasty 10/26/2019.  He states the hip overall is doing well.  He also underwent a right total hip back in March.  He states he has some discomfort with the first few steps in both hips but walks out quickly.  In regards to his left knee pain this is resolving he is working on Dance movement psychotherapist.  Review of systems: No fevers or chills.  Physical exam: Bilateral hips good range of motion without pain.  Left hip surgical incisions healed well no signs of infection.  Left knee no abnormal warmth erythema or effusion good range of motion.  Less quad atrophy than prior exam.  Slight patellofemoral crepitus left knee.  Impression: Status post left total hip arthroplasty 10/26/2019  Plan: We will see him back next June get an AP pelvis lateral view of both hips.  Sooner if there is any questions concerns.  He will continue to work on quad strengthening particularly of the left knee.  Questions were encouraged and answered .

## 2021-01-08 ENCOUNTER — Encounter: Payer: Self-pay | Admitting: Physician Assistant

## 2021-01-08 ENCOUNTER — Ambulatory Visit (INDEPENDENT_AMBULATORY_CARE_PROVIDER_SITE_OTHER): Payer: Medicare Other

## 2021-01-08 ENCOUNTER — Other Ambulatory Visit: Payer: Self-pay

## 2021-01-08 ENCOUNTER — Ambulatory Visit (INDEPENDENT_AMBULATORY_CARE_PROVIDER_SITE_OTHER): Payer: Medicare Other | Admitting: Physician Assistant

## 2021-01-08 DIAGNOSIS — Z96641 Presence of right artificial hip joint: Secondary | ICD-10-CM

## 2021-01-08 DIAGNOSIS — Z96642 Presence of left artificial hip joint: Secondary | ICD-10-CM | POA: Diagnosis not present

## 2021-01-08 NOTE — Progress Notes (Signed)
HPI: Mr. Corrales returns today status post right total hip arthroplasty 08/17/2019 and left total hip arthroplasty 10/26/2019.  States overall he is doing great.  He is back to bowling playing golf.  He does feel that he is work hard to reverse the atrophy of his left quad muscles.  He does feel that the hips felt mechanical at first but now feel more normal.  He has no complaints.  Physical exam: General well-developed well-nourished male no acute distress ambulates without any assistive device.  He has fluid range of motion of both hips with some limitations of external rotation bilaterally.  Dorsiflexion plantarflexion bilateral ankles intact.   Radiographs: AP pelvis and lateral view of the both hips: No acute fractures.  Both hips well located.  Well-seated total hip arthroplasties without any complicating features.  Impression: Status post left total hip arthroplasty 10/26/2019 Status post right total hip arthroplasty 08/17/2019  Plan: He will continue to work on range of motion strengthening both hips.  Follow-up with [place] as needed.  Questions encouraged and answered

## 2021-07-02 IMAGING — DX DG PORTABLE PELVIS
1 series · 1 of 1 positions shown · non-contrast
Comparison: Intraoperative films from earlier in the same day

CLINICAL DATA: Status post right hip replacement

EXAM:
PORTABLE PELVIS 1-2 VIEWS

[pelvis ap]
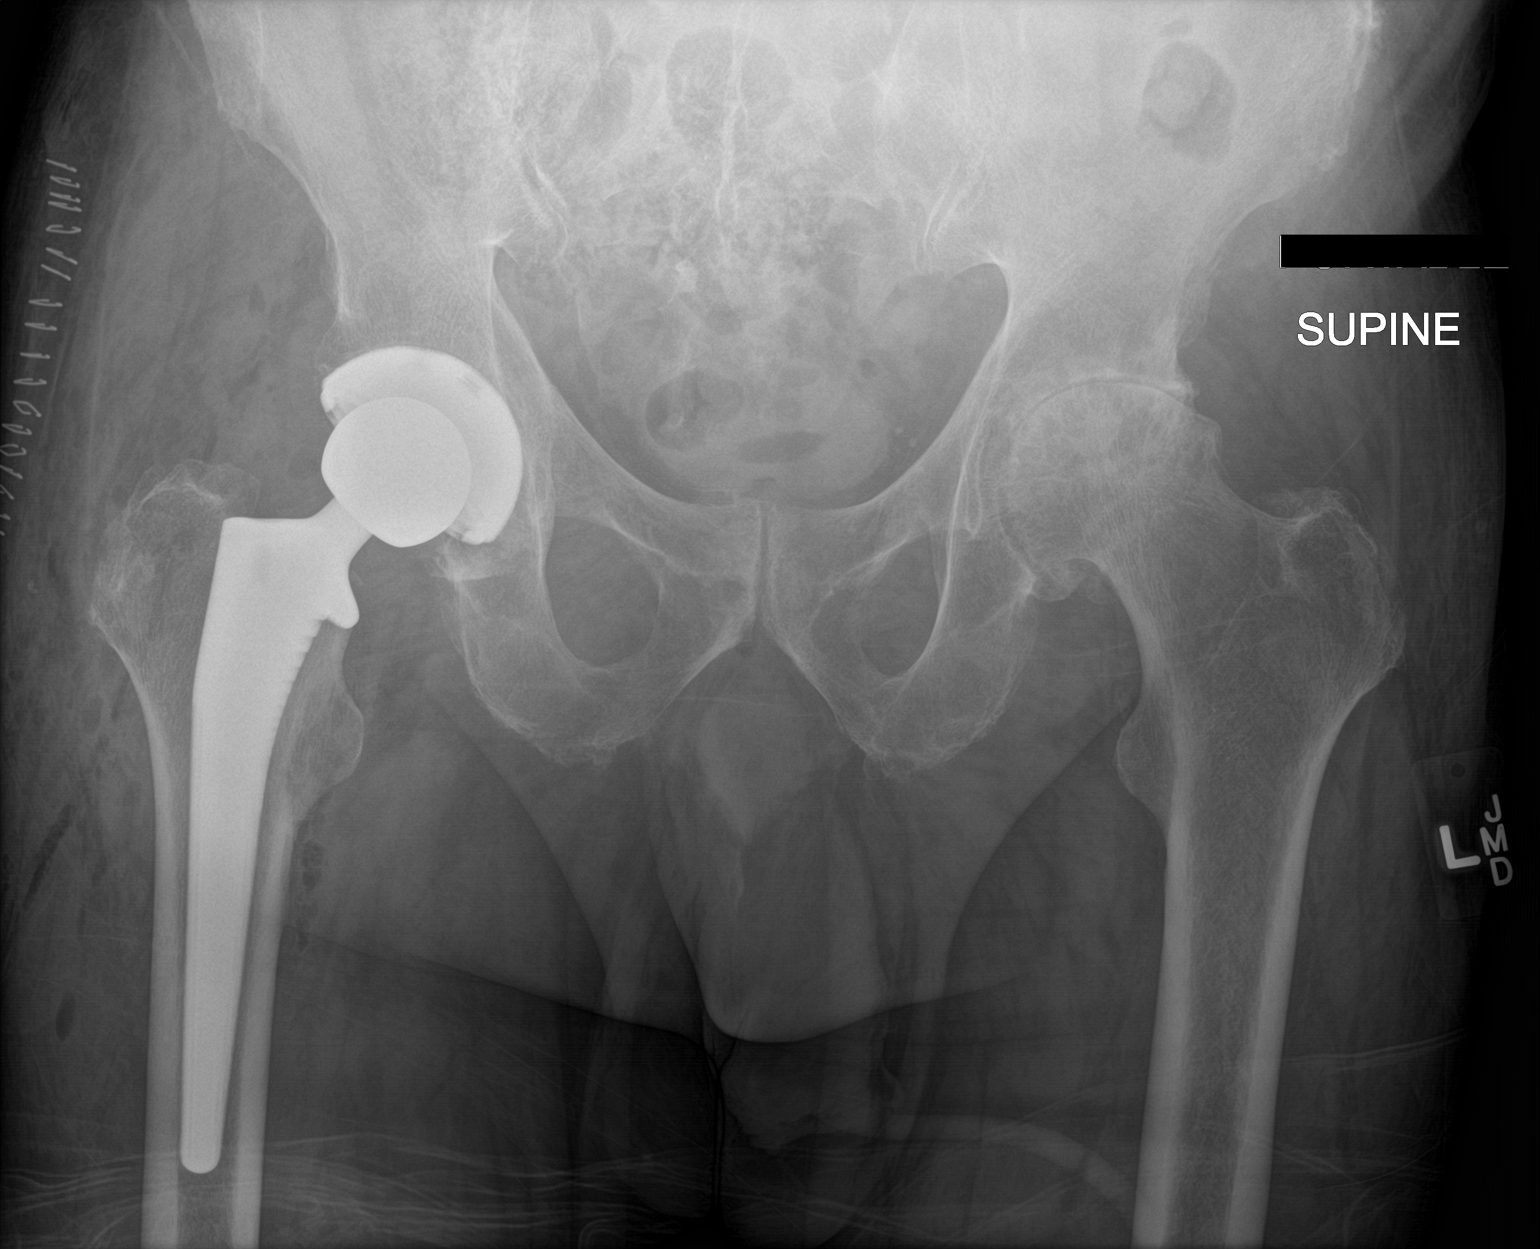

[1 of 1 positions shown; findings below may reference images not displayed]

FINDINGS: Right hip prosthesis is noted in satisfactory position. No acute
bony or soft tissue abnormality is noted. Degenerative changes of
the left hip joint are seen.
IMPRESSION: Status post right hip replacement

## 2021-09-11 IMAGING — DX DG CHEST 1V PORT
1 series · 1 of 1 positions shown · non-contrast
Comparison: None.

CLINICAL DATA: One day status post hip arthroplasty. Decreased
oxygen saturation this morning.

EXAM:
PORTABLE CHEST 1 VIEW

[chest ap]
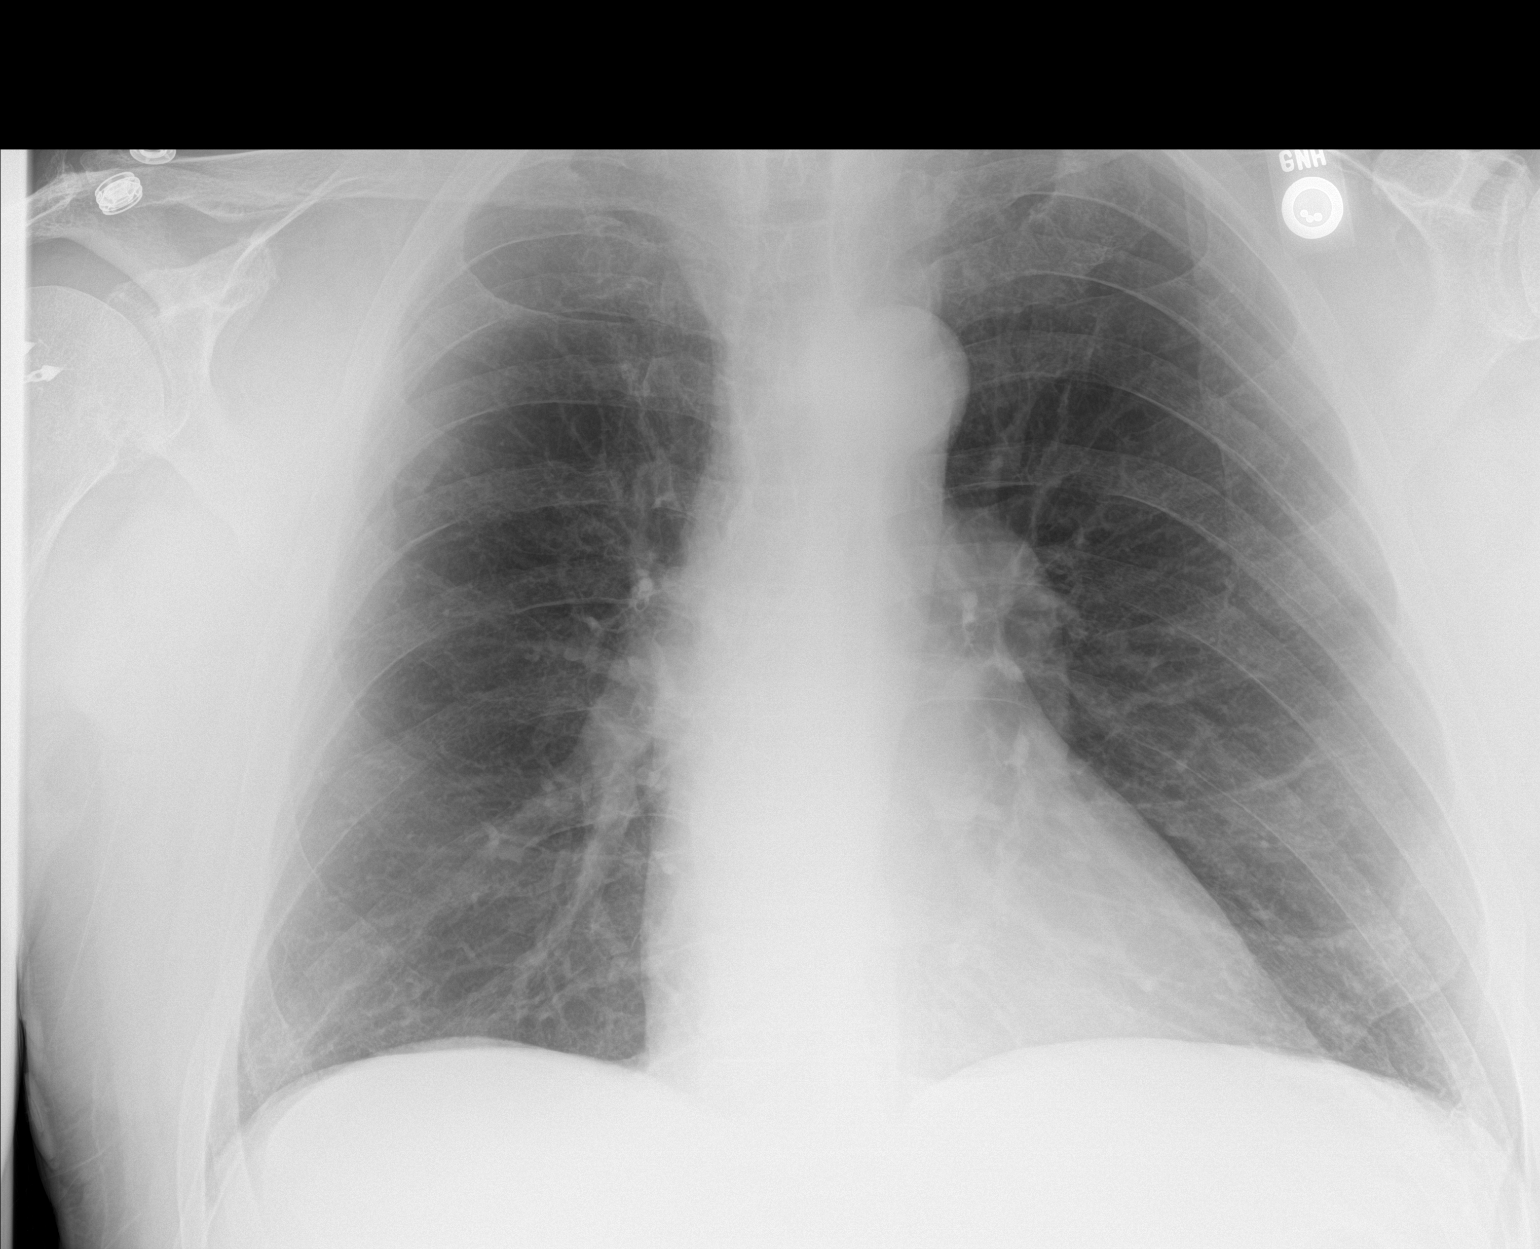

[1 of 1 positions shown; findings below may reference images not displayed]

FINDINGS: The heart size and mediastinal contours are within normal limits.
Both lungs are clear. The visualized skeletal structures are
unremarkable.
IMPRESSION: No active disease.
